# Patient Record
Sex: Male | Born: 1960 | Race: White | Hispanic: No | Marital: Married | Smoking: Current every day smoker
Health system: Southern US, Community
[De-identification: ages and names within clinical notes are randomized; demographics above are authoritative.]

## PROBLEM LIST (undated history)

## (undated) DIAGNOSIS — I10 Essential (primary) hypertension: Secondary | ICD-10-CM

## (undated) DIAGNOSIS — M109 Gout, unspecified: Secondary | ICD-10-CM

---

## 2006-06-27 ENCOUNTER — Ambulatory Visit (HOSPITAL_COMMUNITY): Admission: RE | Admit: 2006-06-27 | Discharge: 2006-06-27 | Payer: Self-pay | Admitting: Family Medicine

## 2013-05-19 ENCOUNTER — Ambulatory Visit (HOSPITAL_COMMUNITY)
Admission: RE | Admit: 2013-05-19 | Discharge: 2013-05-19 | Disposition: A | Discharge status: Home / Self Care | Payer: No Typology Code available for payment source | Source: Ambulatory Visit | Attending: Family Medicine | Admitting: Family Medicine

## 2013-05-19 ENCOUNTER — Other Ambulatory Visit (HOSPITAL_COMMUNITY): Payer: Self-pay | Admitting: Family Medicine

## 2013-05-19 DIAGNOSIS — J449 Chronic obstructive pulmonary disease, unspecified: Secondary | ICD-10-CM

## 2013-05-19 DIAGNOSIS — F17200 Nicotine dependence, unspecified, uncomplicated: Secondary | ICD-10-CM

## 2013-05-19 DIAGNOSIS — J438 Other emphysema: Secondary | ICD-10-CM | POA: Insufficient documentation

## 2015-12-31 ENCOUNTER — Encounter (HOSPITAL_COMMUNITY): Payer: Self-pay | Admitting: *Deleted

## 2015-12-31 ENCOUNTER — Emergency Department (HOSPITAL_COMMUNITY): Payer: BLUE CROSS/BLUE SHIELD

## 2015-12-31 ENCOUNTER — Emergency Department (HOSPITAL_COMMUNITY)
Admission: EM | Admit: 2015-12-31 | Discharge: 2015-12-31 | Disposition: A | Discharge status: Home / Self Care | Payer: BLUE CROSS/BLUE SHIELD | Attending: Emergency Medicine | Admitting: Emergency Medicine

## 2015-12-31 DIAGNOSIS — Z23 Encounter for immunization: Secondary | ICD-10-CM | POA: Diagnosis not present

## 2015-12-31 DIAGNOSIS — F10920 Alcohol use, unspecified with intoxication, uncomplicated: Secondary | ICD-10-CM

## 2015-12-31 DIAGNOSIS — Y929 Unspecified place or not applicable: Secondary | ICD-10-CM | POA: Insufficient documentation

## 2015-12-31 DIAGNOSIS — S0101XA Laceration without foreign body of scalp, initial encounter: Secondary | ICD-10-CM

## 2015-12-31 DIAGNOSIS — F10120 Alcohol abuse with intoxication, uncomplicated: Secondary | ICD-10-CM | POA: Diagnosis not present

## 2015-12-31 DIAGNOSIS — Y939 Activity, unspecified: Secondary | ICD-10-CM | POA: Insufficient documentation

## 2015-12-31 DIAGNOSIS — F17210 Nicotine dependence, cigarettes, uncomplicated: Secondary | ICD-10-CM | POA: Diagnosis not present

## 2015-12-31 DIAGNOSIS — S0990XA Unspecified injury of head, initial encounter: Secondary | ICD-10-CM

## 2015-12-31 DIAGNOSIS — I10 Essential (primary) hypertension: Secondary | ICD-10-CM | POA: Insufficient documentation

## 2015-12-31 DIAGNOSIS — Y999 Unspecified external cause status: Secondary | ICD-10-CM | POA: Diagnosis not present

## 2015-12-31 DIAGNOSIS — Z79899 Other long term (current) drug therapy: Secondary | ICD-10-CM | POA: Insufficient documentation

## 2015-12-31 DIAGNOSIS — W1809XA Striking against other object with subsequent fall, initial encounter: Secondary | ICD-10-CM | POA: Diagnosis not present

## 2015-12-31 HISTORY — DX: Essential (primary) hypertension: I10

## 2015-12-31 HISTORY — DX: Gout, unspecified: M10.9

## 2015-12-31 MED ORDER — TETANUS-DIPHTH-ACELL PERTUSSIS 5-2.5-18.5 LF-MCG/0.5 IM SUSP
0.5000 mL | Freq: Once | INTRAMUSCULAR | Status: AC
  Administered 2015-12-31: 0.5 mL via INTRAMUSCULAR
  Filled 2015-12-31: qty 0.5

## 2015-12-31 MED ORDER — LIDOCAINE-EPINEPHRINE (PF) 1 %-1:200000 IJ SOLN
10.0000 mL | Freq: Once | INTRAMUSCULAR | Status: DC
  Filled 2015-12-31: qty 10

## 2015-12-31 MED ORDER — LIDOCAINE-EPINEPHRINE (PF) 1 %-1:200000 IJ SOLN
INTRAMUSCULAR | Status: AC
  Filled 2015-12-31: qty 30

## 2015-12-31 NOTE — ED Notes (Signed)
Pt states he has been taking OTC cold medicine, he know his BP is high and will take BP med once he gets home

## 2015-12-31 NOTE — ED Provider Notes (Signed)
TIME SEEN: 2:15 AM  CHIEF COMPLAINT: Head injury, intoxication  HPI: Pt is a 55 y.o. male with history of hypertension, gout, alcohol and tobacco abuse who presents the emergency department with complaints of head injury. 14 drink half pint of alcohol a day and fell backward striking his head. No loss of consciousness. Not on anticoagulation. Denies numbness, tingling or focal weakness. Has a 7 cm laceration to the back of his scalp. Unsure when his last tetanus vaccination was. No other injury. Denies pain.  ROS: See HPI Constitutional: no fever  Eyes: no drainage  ENT: no runny nose   Cardiovascular:  no chest pain  Resp: no SOB  GI: no vomiting GU: no dysuria Integumentary: no rash  Allergy: no hives  Musculoskeletal: no leg swelling  Neurological: no slurred speech ROS otherwise negative  PAST MEDICAL HISTORY/PAST SURGICAL HISTORY:  Past Medical History  Diagnosis Date  . Hypertension   . Gout     MEDICATIONS:  Prior to Admission medications   Medication Sig Start Date End Date Taking? Authorizing Provider  allopurinol (ZYLOPRIM) 100 MG tablet Take 100 mg by mouth daily.   Yes Historical Provider, MD  olmesartan (BENICAR) 20 MG tablet Take 20 mg by mouth daily.   Yes Historical Provider, MD    ALLERGIES:  No Known Allergies  SOCIAL HISTORY:  Social History  Substance Use Topics  . Smoking status: Current Every Day Smoker    Types: Cigarettes  . Smokeless tobacco: Not on file  . Alcohol Use: Yes    FAMILY HISTORY: No family history on file.  EXAM: BP 188/92 mmHg  Pulse 106  Temp(Src) 97.6 F (36.4 C) (Oral)  Resp 20  Ht 5\' 11"  (1.803 m)  Wt 141 lb (63.957 kg)  BMI 19.67 kg/m2  SpO2 100% CONSTITUTIONAL: Alert and oriented and responds appropriately to questions. Chronically ill-appearing, appears intoxicated, GCS 15, appears intoxicated HEAD: Normocephalic; 8 cm laceration to the posterior inferior scalp EYES: Conjunctivae clear, PERRL, EOMI ENT: normal  nose; no rhinorrhea; moist mucous membranes; pharynx without lesions noted; no dental injury; no septal hematoma NECK: Supple, no meningismus, no LAD; no midline spinal tenderness, step-off or deformity CARD: RRR; S1 and S2 appreciated; no murmurs, no clicks, no rubs, no gallops RESP: Normal chest excursion without splinting or tachypnea; breath sounds clear and equal bilaterally; no wheezes, no rhonchi, no rales; no hypoxia or respiratory distress CHEST:  chest wall stable, no crepitus or ecchymosis or deformity, nontender to palpation ABD/GI: Normal bowel sounds; non-distended; soft, non-tender, no rebound, no guarding PELVIS:  stable, nontender to palpation BACK:  The back appears normal and is non-tender to palpation, there is no CVA tenderness; no midline spinal tenderness, step-off or deformity EXT: Normal ROM in all joints; non-tender to palpation; no edema; normal capillary refill; no cyanosis, no bony tenderness or bony deformity of patient's extremities, no joint effusion, no ecchymosis or lacerations    SKIN: Normal color for age and race; warm NEURO: Moves all extremities equally, sensation to light touch intact diffusely, cranial nerves II through XII intact, slightly unsteady gait PSYCH: The patient's mood and manner are appropriate. Grooming and personal hygiene are appropriate.  MEDICAL DECISION MAKING: Patient here intoxicated with a fall. Has a large laceration to the back of his scalp which we will cleaned and stapled close. Will update his tetanus vaccination. Will CT his head and cervical spine given he is intoxicated. No other sign of trauma on exam. He is neurologically intact other than slightly unsteady  gait secondary to being intoxicated. Will monitor patient in the emergency department. Patient and his family are very eager to be discharged but agreed to stay for imaging.  ED PROGRESS: Patient's imaging shows no intracranial injury, fracture of the cervical spine or  subluxation. No intracranial hemorrhage. Laceration has been repaired. Repair using staples. He was irrigated copiously with saline. Tetanus has been updated. No foreign bodies or dirty wound seen on exam. I do not feel he needs to be on prophylactic antibiotics. He has been monitored and appears more chronically sober than previous. He is able to ambulate with a steady gait. Slurred speech has also improved. His nephew is at bedside who is clinically sober will drive him home. He does not have any further complaints. No other sign of trauma on exam. Discussed head injury return precautions and recommended they follow-up with her PCP Dr. Maudie Mercury to have staples removed in one week. Wife at bedside as well.    At this time, I do not feel there is any life-threatening condition present. I have reviewed and discussed all results (EKG, imaging, lab, urine as appropriate), exam findings with patient. I have reviewed nursing notes and appropriate previous records.  I feel the patient is safe to be discharged home without further emergent workup. Discussed usual and customary return precautions. Patient and family (if present) verbalize understanding and are comfortable with this plan.  Patient will follow-up with their primary care provider. If they do not have a primary care provider, information for follow-up has been provided to them. All questions have been answered.   LACERATION REPAIR Performed by: Nyra Jabs Authorized by: Nyra Jabs Consent: Verbal consent obtained. Risks and benefits: risks, benefits and alternatives were discussed Consent given by: patient Patient identity confirmed: provided demographic data Prepped and Draped in normal sterile fashion Wound explored  Laceration Location: Scalp laceration  Laceration Length: 8 cm  No Foreign Bodies seen or palpated  Anesthesia: local infiltration  Local anesthetic: lidocaine 1 % with epinephrine  Anesthetic total: 5  ml  Irrigation method: 1 L bottle of saline  Amount of cleaning: Copious irrigation   Skin closure: Superficial with staples   Number of sutures: 9 staples   Technique: Area anesthetized using lidocaine 1% with epinephrine. Irrigated copiously with saline, cleaned with Betadine and 9 staples placed with good approximation of wound edges.   Patient tolerance: Patient tolerated the procedure well with no immediate complications.     [place], DO 12/31/15 [phone]

## 2015-12-31 NOTE — ED Notes (Signed)
Patient given discharge instruction, verbalized understand. Patient ambulatory out of the department with wife and son. Son states he is driving.

## 2015-12-31 NOTE — Discharge Instructions (Signed)
Alcohol Intoxication °Alcohol intoxication occurs when the amount of alcohol that a person has consumed impairs his or her ability to mentally and physically function. Alcohol directly impairs the normal chemical activity of the brain. Drinking large amounts of alcohol can lead to changes in mental function and behavior, and it can cause many physical effects that can be harmful.  °Alcohol intoxication can range in severity from mild to very severe. Various factors can affect the level of intoxication that occurs, such as the person's age, gender, weight, frequency of alcohol consumption, and the presence of other medical conditions (such as diabetes, seizures, or heart conditions). Dangerous levels of alcohol intoxication may occur when people drink large amounts of alcohol in a short period (binge drinking). Alcohol can also be especially dangerous when combined with certain prescription medicines or "recreational" drugs. °SIGNS AND SYMPTOMS °Some common signs and symptoms of mild alcohol intoxication include: °· Loss of coordination. °· Changes in mood and behavior. °· Impaired judgment. °· Slurred speech. °As alcohol intoxication progresses to more severe levels, other signs and symptoms will appear. These may include: °· Vomiting. °· Confusion and impaired memory. °· Slowed breathing. °· Seizures. °· Loss of consciousness. °DIAGNOSIS  °Your health care provider will take a medical history and perform a physical exam. You will be asked about the amount and type of alcohol you have consumed. Blood tests will be done to measure the concentration of alcohol in your blood. In many places, your blood alcohol level must be lower than 80 mg/dL (0.08%) to legally drive. However, many dangerous effects of alcohol can occur at much lower levels.  °TREATMENT  °People with alcohol intoxication often do not require treatment. Most of the effects of alcohol intoxication are temporary, and they go away as the alcohol naturally  leaves the body. Your health care provider will monitor your condition until you are stable enough to go home. Fluids are sometimes given through an IV access tube to help prevent dehydration.  °HOME CARE INSTRUCTIONS °· Do not drive after drinking alcohol. °· Stay hydrated. Drink enough water and fluids to keep your urine clear or pale yellow. Avoid caffeine.   °· Only take over-the-counter or prescription medicines as directed by your health care provider.   °SEEK MEDICAL CARE IF:  °· You have persistent vomiting.   °· You do not feel better after a few days. °· You have frequent alcohol intoxication. Your health care provider can help determine if you should see a substance use treatment counselor. °SEEK IMMEDIATE MEDICAL CARE IF:  °· You become shaky or tremble when you try to stop drinking.   °· You shake uncontrollably (seizure).   °· You throw up (vomit) blood. This may be bright red or may look like black coffee grounds.   °· You have blood in your stool. This may be bright red or may appear as a black, tarry, bad smelling stool.   °· You become lightheaded or faint.   °MAKE SURE YOU:  °· Understand these instructions. °· Will watch your condition. °· Will get help right away if you are not doing well or get worse. °  °This information is not intended to replace advice given to you by your health care provider. Make sure you discuss any questions you have with your health care provider. °  °Document Released: 06/27/2005 Document Revised: 05/20/2013 Document Reviewed: 02/20/2013 °Elsevier Interactive Patient Education ©2016 [employer] ° °Head Injury, Adult °You have a head injury. Headaches and throwing up (vomiting) are common after a head injury. It should   be easy to wake up from sleeping. Sometimes you must stay in the hospital. Most problems happen within the first 24 hours. Side effects may occur up to 7-10 days after the injury.  WHAT ARE THE TYPES OF HEAD INJURIES? Head injuries can be as minor as  a bump. Some head injuries can be more severe. More severe head injuries include:  A jarring injury to the brain (concussion).  A bruise of the brain (contusion). This mean there is bleeding in the brain that can cause swelling.  A cracked skull (skull fracture).  Bleeding in the brain that collects, clots, and forms a bump (hematoma). WHEN SHOULD I GET HELP RIGHT AWAY?   You are confused or sleepy.  You cannot be woken up.  You feel sick to your stomach (nauseous) or keep throwing up (vomiting).  Your dizziness or unsteadiness is getting worse.  You have very bad, lasting headaches that are not helped by medicine. Take medicines only as told by your doctor.  You cannot use your arms or legs like normal.  You cannot walk.  You notice changes in the black spots in the center of the colored part of your eye (pupil).  You have clear or bloody fluid coming from your nose or ears.  You have trouble seeing. During the next 24 hours after the injury, you must stay with someone who can watch you. This person should get help right away (call 911 in the [place] if you start to shake and are not able to control it (have seizures), you pass out, or you are unable to wake up. HOW CAN I PREVENT A HEAD INJURY IN THE FUTURE?  Wear seat belts.  Wear a helmet while bike riding and playing sports like football.  Stay away from dangerous activities around the house. WHEN CAN I RETURN TO NORMAL ACTIVITIES AND ATHLETICS? See your doctor before doing these activities. You should not do normal activities or play contact sports until 1 week after the following symptoms have stopped:  Headache that does not go away.  Dizziness.  Poor attention.  Confusion.  Memory problems.  Sickness to your stomach or throwing up.  Tiredness.  Fussiness.  Bothered by bright lights or loud noises.  Anxiousness or depression.  Restless sleep. MAKE SURE YOU:   Understand these instructions.  Will  watch your condition.  Will get help right away if you are not doing well or get worse.   This information is not intended to replace advice given to you by your health care provider. Make sure you discuss any questions you have with your health care provider.   Document Released: 08/30/2008 Document Revised: 10/08/2014 Document Reviewed: 05/25/2013 Elsevier Interactive Patient Education 2016 [place], Adult A laceration is a cut that goes through all of the layers of the skin and into the tissue that is right under the skin. Some lacerations heal on their own. Others need to be closed with stitches (sutures), staples, skin adhesive strips, or skin glue. Proper laceration care minimizes the risk of infection and helps the laceration to heal better. HOW TO CARE FOR YOUR LACERATION If sutures or staples were used:  Keep the wound clean and dry.  If you were given a bandage (dressing), you should change it at least one time per day or as told by your health care provider. You should also change it if it becomes wet or dirty.  Keep the wound completely dry for the first 24 hours or  as told by your health care provider. After that time, you may shower or bathe. However, make sure that the wound is not soaked in water until after the sutures or staples have been removed.  Clean the wound one time each day or as told by your health care provider:  Wash the wound with soap and water.  Rinse the wound with water to remove all soap.  Pat the wound dry with a clean towel. Do not rub the wound.  After cleaning the wound, apply a thin layer of antibiotic ointmentas told by your health care provider. This will help to prevent infection and keep the dressing from sticking to the wound.  Have the sutures or staples removed as told by your health care provider. If skin adhesive strips were used:  Keep the wound clean and dry.  If you were given a bandage (dressing), you  should change it at least one time per day or as told by your health care provider. You should also change it if it becomes dirty or wet.  Do not get the skin adhesive strips wet. You may shower or bathe, but be careful to keep the wound dry.  If the wound gets wet, pat it dry with a clean towel. Do not rub the wound.  Skin adhesive strips fall off on their own. You may trim the strips as the wound heals. Do not remove skin adhesive strips that are still stuck to the wound. They will fall off in time. If skin glue was used:  Try to keep the wound dry, but you may briefly wet it in the shower or bath. Do not soak the wound in water, such as by swimming.  After you have showered or bathed, gently pat the wound dry with a clean towel. Do not rub the wound.  Do not do any activities that will make you sweat heavily until the skin glue has fallen off on its own.  Do not apply liquid, cream, or ointment medicine to the wound while the skin glue is in place. Using those may loosen the film before the wound has healed.  If you were given a bandage (dressing), you should change it at least one time per day or as told by your health care provider. You should also change it if it becomes dirty or wet.  If a dressing is placed over the wound, be careful not to apply tape directly over the skin glue. Doing that may cause the glue to be pulled off before the wound has healed.  Do not pick at the glue. The skin glue usually remains in place for 5-10 days, then it falls off of the skin. General Instructions  Take over-the-counter and prescription medicines only as told by your health care provider.  If you were prescribed an antibiotic medicine or ointment, take or apply it as told by your doctor. Do not stop using it even if your condition improves.  To help prevent scarring, make sure to cover your wound with sunscreen whenever you are outside after stitches are removed, after adhesive strips are  removed, or when glue remains in place and the wound is healed. Make sure to wear a sunscreen of at least 30 SPF.  Do not scratch or pick at the wound.  Keep all follow-up visits as told by your health care provider. This is important.  Check your wound every day for signs of infection. Watch for:  Redness, swelling, or pain.  Fluid, blood, or  pus.  Raise (elevate) the injured area above the level of your heart while you are sitting or lying down, if possible. SEEK MEDICAL CARE IF:  You received a tetanus shot and you have swelling, severe pain, redness, or bleeding at the injection site.  You have a fever.  A wound that was closed breaks open.  You notice a bad smell coming from your wound or your dressing.  You notice something coming out of the wound, such as wood or glass.  Your pain is not controlled with medicine.  You have increased redness, swelling, or pain at the site of your wound.  You have fluid, blood, or pus coming from your wound.  You notice a change in the color of your skin near your wound.  You need to change the dressing frequently due to fluid, blood, or pus draining from the wound.  You develop a new rash.  You develop numbness around the wound. SEEK IMMEDIATE MEDICAL CARE IF:  You develop severe swelling around the wound.  Your pain suddenly increases and is severe.  You develop painful lumps near the wound or on skin that is anywhere on your body.  You have a red streak going away from your wound.  The wound is on your hand or foot and you cannot properly move a finger or toe.  The wound is on your hand or foot and you notice that your fingers or toes look pale or bluish.   This information is not intended to replace advice given to you by your health care provider. Make sure you discuss any questions you have with your health care provider.   Document Released: 09/17/2005 Document Revised: 02/01/2015 Document Reviewed:  09/13/2014 Elsevier Interactive Patient Education [employer].

## 2015-12-31 NOTE — ED Notes (Signed)
Pt states he drank 1/2 pint, drinks daily, fell off deck, hitting back of head, denies LOC, wrapped head, bleeding controlled

## 2015-12-31 NOTE — ED Notes (Signed)
Pt fell backwards hitting his head on the deck. Pt denies any LOC.

## 2016-07-17 ENCOUNTER — Encounter: Payer: Self-pay | Admitting: Internal Medicine

## 2016-08-06 ENCOUNTER — Ambulatory Visit: Payer: BLUE CROSS/BLUE SHIELD | Admitting: Gastroenterology

## 2018-08-13 ENCOUNTER — Emergency Department (HOSPITAL_COMMUNITY): Payer: BLUE CROSS/BLUE SHIELD

## 2018-08-13 ENCOUNTER — Encounter (HOSPITAL_COMMUNITY): Payer: Self-pay | Admitting: Emergency Medicine

## 2018-08-13 ENCOUNTER — Inpatient Hospital Stay (HOSPITAL_COMMUNITY)
Admission: EM | Admit: 2018-08-13 | Discharge: 2018-08-26 | DRG: 917 | Disposition: A | Discharge status: Home / Self Care | Payer: BLUE CROSS/BLUE SHIELD | Attending: Internal Medicine | Admitting: Internal Medicine

## 2018-08-13 DIAGNOSIS — D539 Nutritional anemia, unspecified: Secondary | ICD-10-CM | POA: Diagnosis present

## 2018-08-13 DIAGNOSIS — F17210 Nicotine dependence, cigarettes, uncomplicated: Secondary | ICD-10-CM | POA: Diagnosis present

## 2018-08-13 DIAGNOSIS — G92 Toxic encephalopathy: Secondary | ICD-10-CM | POA: Diagnosis present

## 2018-08-13 DIAGNOSIS — F332 Major depressive disorder, recurrent severe without psychotic features: Secondary | ICD-10-CM

## 2018-08-13 DIAGNOSIS — H0589 Other disorders of orbit: Secondary | ICD-10-CM | POA: Diagnosis not present

## 2018-08-13 DIAGNOSIS — F4325 Adjustment disorder with mixed disturbance of emotions and conduct: Secondary | ICD-10-CM | POA: Diagnosis present

## 2018-08-13 DIAGNOSIS — R1310 Dysphagia, unspecified: Secondary | ICD-10-CM | POA: Diagnosis not present

## 2018-08-13 DIAGNOSIS — M109 Gout, unspecified: Secondary | ICD-10-CM | POA: Diagnosis present

## 2018-08-13 DIAGNOSIS — E162 Hypoglycemia, unspecified: Secondary | ICD-10-CM | POA: Diagnosis not present

## 2018-08-13 DIAGNOSIS — I10 Essential (primary) hypertension: Secondary | ICD-10-CM | POA: Diagnosis present

## 2018-08-13 DIAGNOSIS — J449 Chronic obstructive pulmonary disease, unspecified: Secondary | ICD-10-CM | POA: Diagnosis present

## 2018-08-13 DIAGNOSIS — Z681 Body mass index (BMI) 19 or less, adult: Secondary | ICD-10-CM | POA: Diagnosis not present

## 2018-08-13 DIAGNOSIS — E785 Hyperlipidemia, unspecified: Secondary | ICD-10-CM | POA: Diagnosis present

## 2018-08-13 DIAGNOSIS — Z634 Disappearance and death of family member: Secondary | ICD-10-CM

## 2018-08-13 DIAGNOSIS — D3162 Benign neoplasm of unspecified site of left orbit: Secondary | ICD-10-CM | POA: Diagnosis present

## 2018-08-13 DIAGNOSIS — Z79899 Other long term (current) drug therapy: Secondary | ICD-10-CM

## 2018-08-13 DIAGNOSIS — F10239 Alcohol dependence with withdrawal, unspecified: Secondary | ICD-10-CM | POA: Diagnosis not present

## 2018-08-13 DIAGNOSIS — D72829 Elevated white blood cell count, unspecified: Secondary | ICD-10-CM | POA: Diagnosis not present

## 2018-08-13 DIAGNOSIS — R4182 Altered mental status, unspecified: Secondary | ICD-10-CM | POA: Diagnosis not present

## 2018-08-13 DIAGNOSIS — T424X2A Poisoning by benzodiazepines, intentional self-harm, initial encounter: Principal | ICD-10-CM | POA: Diagnosis present

## 2018-08-13 DIAGNOSIS — F10231 Alcohol dependence with withdrawal delirium: Secondary | ICD-10-CM | POA: Diagnosis not present

## 2018-08-13 DIAGNOSIS — Y92009 Unspecified place in unspecified non-institutional (private) residence as the place of occurrence of the external cause: Secondary | ICD-10-CM

## 2018-08-13 DIAGNOSIS — E440 Moderate protein-calorie malnutrition: Secondary | ICD-10-CM | POA: Diagnosis present

## 2018-08-13 DIAGNOSIS — Z9911 Dependence on respirator [ventilator] status: Secondary | ICD-10-CM

## 2018-08-13 DIAGNOSIS — J9690 Respiratory failure, unspecified, unspecified whether with hypoxia or hypercapnia: Secondary | ICD-10-CM

## 2018-08-13 DIAGNOSIS — J9601 Acute respiratory failure with hypoxia: Secondary | ICD-10-CM | POA: Diagnosis present

## 2018-08-13 DIAGNOSIS — J690 Pneumonitis due to inhalation of food and vomit: Secondary | ICD-10-CM | POA: Diagnosis present

## 2018-08-13 DIAGNOSIS — Z781 Physical restraint status: Secondary | ICD-10-CM | POA: Diagnosis not present

## 2018-08-13 DIAGNOSIS — F10229 Alcohol dependence with intoxication, unspecified: Secondary | ICD-10-CM | POA: Diagnosis present

## 2018-08-13 DIAGNOSIS — F10129 Alcohol abuse with intoxication, unspecified: Secondary | ICD-10-CM | POA: Diagnosis not present

## 2018-08-13 DIAGNOSIS — G9340 Encephalopathy, unspecified: Secondary | ICD-10-CM | POA: Diagnosis not present

## 2018-08-13 DIAGNOSIS — G9341 Metabolic encephalopathy: Secondary | ICD-10-CM | POA: Diagnosis not present

## 2018-08-13 LAB — COMPREHENSIVE METABOLIC PANEL
ALT: 32 U/L (ref 0–44)
AST: 35 U/L (ref 15–41)
Albumin: 4.2 g/dL (ref 3.5–5.0)
Alkaline Phosphatase: 41 U/L (ref 38–126)
Anion gap: 13 (ref 5–15)
BUN: 38 mg/dL — ABNORMAL HIGH (ref 6–20)
CHLORIDE: 104 mmol/L (ref 98–111)
CO2: 21 mmol/L — ABNORMAL LOW (ref 22–32)
Calcium: 9.3 mg/dL (ref 8.9–10.3)
Creatinine, Ser: 1.49 mg/dL — ABNORMAL HIGH (ref 0.61–1.24)
GFR, EST AFRICAN AMERICAN: 58 mL/min — AB (ref >60)
GFR, EST NON AFRICAN AMERICAN: 50 mL/min — AB (ref >60)
Glucose, Bld: 106 mg/dL — ABNORMAL HIGH (ref 70–99)
POTASSIUM: 4.5 mmol/L (ref 3.5–5.1)
SODIUM: 138 mmol/L (ref 135–145)
Total Bilirubin: 1 mg/dL (ref 0.3–1.2)
Total Protein: 7 g/dL (ref 6.5–8.1)

## 2018-08-13 LAB — GLUCOSE, CAPILLARY
GLUCOSE-CAPILLARY: 133 mg/dL — AB (ref 70–99)
Glucose-Capillary: 103 mg/dL — ABNORMAL HIGH (ref 70–99)

## 2018-08-13 LAB — CBC WITH DIFFERENTIAL/PLATELET
Abs Immature Granulocytes: 0.01 10*3/uL (ref 0.00–0.07)
BASOS ABS: 0.1 10*3/uL (ref 0.0–0.1)
Basophils Relative: 1 %
Eosinophils Absolute: 0.1 10*3/uL (ref 0.0–0.5)
Eosinophils Relative: 2 %
HCT: 40.3 % (ref 39.0–52.0)
Hemoglobin: 13.2 g/dL (ref 13.0–17.0)
IMMATURE GRANULOCYTES: 0 %
LYMPHS PCT: 29 %
Lymphs Abs: 1.9 10*3/uL (ref 0.7–4.0)
MCH: 34.2 pg — AB (ref 26.0–34.0)
MCHC: 32.8 g/dL (ref 30.0–36.0)
MCV: 104.4 fL — AB (ref 80.0–100.0)
Monocytes Absolute: 0.8 10*3/uL (ref 0.1–1.0)
Monocytes Relative: 12 %
NEUTROS ABS: 3.6 10*3/uL (ref 1.7–7.7)
NEUTROS PCT: 56 %
PLATELETS: 207 10*3/uL (ref 150–400)
RBC: 3.86 MIL/uL — AB (ref 4.22–5.81)
RDW: 13.8 % (ref 11.5–15.5)
WBC: 6.4 10*3/uL (ref 4.0–10.5)
nRBC: 0 % (ref 0.0–0.2)

## 2018-08-13 LAB — RAPID URINE DRUG SCREEN, HOSP PERFORMED
AMPHETAMINES: NOT DETECTED
BENZODIAZEPINES: POSITIVE — AB
Barbiturates: NOT DETECTED
Cocaine: NOT DETECTED
OPIATES: NOT DETECTED
Tetrahydrocannabinol: NOT DETECTED

## 2018-08-13 LAB — POCT I-STAT 3, ART BLOOD GAS (G3+)
Acid-base deficit: 8 mmol/L — ABNORMAL HIGH (ref 0.0–2.0)
Bicarbonate: 17.8 mmol/L — ABNORMAL LOW (ref 20.0–28.0)
O2 SAT: 95 %
PCO2 ART: 36.2 mmHg (ref 32.0–48.0)
PO2 ART: 81 mmHg — AB (ref 83.0–108.0)
Patient temperature: 98.6
TCO2: 19 mmol/L — AB (ref 22–32)
pH, Arterial: 7.301 — ABNORMAL LOW (ref 7.350–7.450)

## 2018-08-13 LAB — URINALYSIS, ROUTINE W REFLEX MICROSCOPIC
BILIRUBIN URINE: NEGATIVE
Glucose, UA: NEGATIVE mg/dL
HGB URINE DIPSTICK: NEGATIVE
Ketones, ur: 5 mg/dL — AB
Leukocytes, UA: NEGATIVE
Nitrite: NEGATIVE
PH: 5 (ref 5.0–8.0)
Protein, ur: NEGATIVE mg/dL
SPECIFIC GRAVITY, URINE: 1.016 (ref 1.005–1.030)

## 2018-08-13 LAB — ETHANOL

## 2018-08-13 LAB — SALICYLATE LEVEL

## 2018-08-13 LAB — I-STAT ARTERIAL BLOOD GAS, ED
ACID-BASE DEFICIT: 3 mmol/L — AB (ref 0.0–2.0)
ACID-BASE DEFICIT: 5 mmol/L — AB (ref 0.0–2.0)
BICARBONATE: 23 mmol/L (ref 20.0–28.0)
BICARBONATE: 21.7 mmol/L (ref 20.0–28.0)
O2 SAT: 89 %
O2 SAT: 79 %
PO2 ART: 58 mmHg — AB (ref 83.0–108.0)
Patient temperature: 97.5
Patient temperature: 97.5
TCO2: 24 mmol/L (ref 22–32)
TCO2: 23 mmol/L (ref 22–32)
pCO2 arterial: 43.1 mmHg (ref 32.0–48.0)
pCO2 arterial: 42.8 mmHg (ref 32.0–48.0)
pH, Arterial: 7.332 — ABNORMAL LOW (ref 7.350–7.450)
pH, Arterial: 7.309 — ABNORMAL LOW (ref 7.350–7.450)
pO2, Arterial: 46 mmHg — ABNORMAL LOW (ref 83.0–108.0)

## 2018-08-13 LAB — I-STAT CHEM 8, ED
BUN: 48 mg/dL — ABNORMAL HIGH (ref 6–20)
CHLORIDE: 106 mmol/L (ref 98–111)
Calcium, Ion: 1.18 mmol/L (ref 1.15–1.40)
Creatinine, Ser: 1.5 mg/dL — ABNORMAL HIGH (ref 0.61–1.24)
Glucose, Bld: 99 mg/dL (ref 70–99)
HEMATOCRIT: 41 % (ref 39.0–52.0)
Hemoglobin: 13.9 g/dL (ref 13.0–17.0)
POTASSIUM: 4.7 mmol/L (ref 3.5–5.1)
Sodium: 139 mmol/L (ref 135–145)
TCO2: 26 mmol/L (ref 22–32)

## 2018-08-13 LAB — ACETAMINOPHEN LEVEL

## 2018-08-13 LAB — TRIGLYCERIDES: Triglycerides: 73 mg/dL (ref <150)

## 2018-08-13 LAB — PHOSPHORUS: PHOSPHORUS: 3.7 mg/dL (ref 2.5–4.6)

## 2018-08-13 LAB — PROCALCITONIN

## 2018-08-13 LAB — CK: Total CK: 915 U/L — ABNORMAL HIGH (ref 49–397)

## 2018-08-13 LAB — MRSA PCR SCREENING: MRSA by PCR: NEGATIVE

## 2018-08-13 LAB — MAGNESIUM: Magnesium: 1.2 mg/dL — ABNORMAL LOW (ref 1.7–2.4)

## 2018-08-13 MED ORDER — ORAL CARE MOUTH RINSE
15.0000 mL | OROMUCOSAL | Status: DC
  Administered 2018-08-13 – 2018-08-15 (×17): 15 mL via OROMUCOSAL

## 2018-08-13 MED ORDER — PROPOFOL 1000 MG/100ML IV EMUL
0.0000 ug/kg/min | INTRAVENOUS | Status: DC
  Administered 2018-08-13 – 2018-08-14 (×2): 30 ug/kg/min via INTRAVENOUS
  Administered 2018-08-14: 20 ug/kg/min via INTRAVENOUS
  Administered 2018-08-14: 15 ug/kg/min via INTRAVENOUS
  Administered 2018-08-15: 20 ug/kg/min via INTRAVENOUS
  Filled 2018-08-13 (×4): qty 100

## 2018-08-13 MED ORDER — PROPOFOL 1000 MG/100ML IV EMUL
INTRAVENOUS | Status: AC
  Filled 2018-08-13: qty 100

## 2018-08-13 MED ORDER — BISACODYL 10 MG RE SUPP: 10.0000 mg | Freq: Every day | RECTAL | Status: DC | PRN

## 2018-08-13 MED ORDER — THIAMINE HCL 100 MG/ML IJ SOLN
100.0000 mg | Freq: Every day | INTRAMUSCULAR | Status: DC
  Administered 2018-08-13: 100 mg via INTRAVENOUS
  Filled 2018-08-13: qty 2

## 2018-08-13 MED ORDER — PROPOFOL 1000 MG/100ML IV EMUL
5.0000 ug/kg/min | Freq: Once | INTRAVENOUS | Status: AC
  Administered 2018-08-13: 20 ug/kg/min via INTRAVENOUS

## 2018-08-13 MED ORDER — DOCUSATE SODIUM 50 MG/5ML PO LIQD: 100.0000 mg | Freq: Two times a day (BID) | ORAL | Status: DC | PRN

## 2018-08-13 MED ORDER — FENTANYL CITRATE (PF) 100 MCG/2ML IJ SOLN
100.0000 ug | INTRAMUSCULAR | Status: DC | PRN
  Administered 2018-08-14 (×2): 100 ug via INTRAVENOUS
  Filled 2018-08-13 (×4): qty 2

## 2018-08-13 MED ORDER — FOLIC ACID 1 MG PO TABS
1.0000 mg | ORAL_TABLET | Freq: Every day | ORAL | Status: DC
  Administered 2018-08-14 – 2018-08-15 (×2): 1 mg
  Filled 2018-08-13 (×2): qty 1

## 2018-08-13 MED ORDER — SUCCINYLCHOLINE CHLORIDE 20 MG/ML IJ SOLN
100.0000 mg | Freq: Once | INTRAMUSCULAR | Status: AC
  Administered 2018-08-13: 100 mg via INTRAVENOUS
  Filled 2018-08-13: qty 5

## 2018-08-13 MED ORDER — FENTANYL CITRATE (PF) 100 MCG/2ML IJ SOLN: 100.0000 ug | INTRAMUSCULAR | Status: DC | PRN

## 2018-08-13 MED ORDER — IPRATROPIUM-ALBUTEROL 0.5-2.5 (3) MG/3ML IN SOLN
3.0000 mL | Freq: Once | RESPIRATORY_TRACT | Status: AC
  Administered 2018-08-13: 3 mL via RESPIRATORY_TRACT
  Filled 2018-08-13: qty 3

## 2018-08-13 MED ORDER — PANTOPRAZOLE SODIUM 40 MG PO PACK
40.0000 mg | PACK | Freq: Every day | ORAL | Status: DC
  Administered 2018-08-14 – 2018-08-15 (×2): 40 mg
  Filled 2018-08-13 (×3): qty 20

## 2018-08-13 MED ORDER — ETOMIDATE 2 MG/ML IV SOLN
20.0000 mg | Freq: Once | INTRAVENOUS | Status: AC
  Administered 2018-08-13: 20 mg via INTRAVENOUS

## 2018-08-13 MED ORDER — SODIUM CHLORIDE 0.9 % IV BOLUS
1000.0000 mL | Freq: Once | INTRAVENOUS | Status: AC
  Administered 2018-08-13: 1000 mL via INTRAVENOUS

## 2018-08-13 MED ORDER — HEPARIN SODIUM (PORCINE) 5000 UNIT/ML IJ SOLN
5000.0000 [IU] | Freq: Three times a day (TID) | INTRAMUSCULAR | Status: DC
  Administered 2018-08-13 – 2018-08-26 (×34): 5000 [IU] via SUBCUTANEOUS
  Filled 2018-08-13 (×35): qty 1

## 2018-08-13 MED ORDER — METHYLPREDNISOLONE SODIUM SUCC 125 MG IJ SOLR
125.0000 mg | Freq: Once | INTRAMUSCULAR | Status: AC
  Administered 2018-08-13: 125 mg via INTRAVENOUS
  Filled 2018-08-13: qty 2

## 2018-08-13 MED ORDER — LORAZEPAM 2 MG/ML IJ SOLN: 2.0000 mg | Freq: Once | INTRAMUSCULAR | Status: DC | PRN

## 2018-08-13 MED ORDER — PIPERACILLIN-TAZOBACTAM 3.375 G IVPB 30 MIN
3.3750 g | Freq: Once | INTRAVENOUS | Status: AC
  Administered 2018-08-13: 3.375 g via INTRAVENOUS
  Filled 2018-08-13: qty 50

## 2018-08-13 MED ORDER — IPRATROPIUM-ALBUTEROL 0.5-2.5 (3) MG/3ML IN SOLN: 3.0000 mL | RESPIRATORY_TRACT | Status: DC | PRN

## 2018-08-13 MED ORDER — CHLORHEXIDINE GLUCONATE 0.12% ORAL RINSE (MEDLINE KIT)
15.0000 mL | Freq: Two times a day (BID) | OROMUCOSAL | Status: DC
  Administered 2018-08-13 – 2018-08-15 (×4): 15 mL via OROMUCOSAL

## 2018-08-13 NOTE — ED Notes (Signed)
Pt's O2 sats 83% on 4L [place]. Placed nonrebreather on pt with minimal improvement to 87%. Pt unresponsive to painful stimuli. Notified MD. Preparing to intubate.

## 2018-08-13 NOTE — ED Notes (Signed)
Patient transported to CT 

## 2018-08-13 NOTE — ED Provider Notes (Signed)
[facility] EMERGENCY DEPARTMENT Provider Note   CSN: [id number] Arrival date & time: 08/13/18  1321     History   Chief Complaint Chief Complaint  Patient presents with  . Altered Mental Status    HPI Jeremiah Tucker is a 57 y.o. male.  HPI 57 year old male with past medical history as below here with altered mental status.  History is limited due to confusion on arrival.  Per EMS report, the patient is a chronic alcoholic.  His wife reportedly passed away last week, on hospice.  He was last seen normal yesterday.  He was found confused by his nephew today and EMS was called.  On EMS arrival, patient was minimally responsive to pain.  Nasal trumpet was placed.  Level 5 caveat invoked as remainder of history, ROS, and physical exam limited due to patient's AMS.  Past Medical History:  Diagnosis Date  . Gout   . Hypertension     Patient Active Problem List   Diagnosis Date Noted  . Acute encephalopathy 08/13/2018    History reviewed. No pertinent surgical history.      Home Medications    Prior to Admission medications   Medication Sig Start Date End Date Taking? Authorizing Provider  allopurinol (ZYLOPRIM) 100 MG tablet Take 100 mg by mouth daily.    [provider]  olmesartan (BENICAR) 20 MG tablet Take 20 mg by mouth daily.    [provider]    Family History No family history on file.  Social History Social History   Tobacco Use  . Smoking status: Current Every Day Smoker    Types: Cigarettes  Substance Use Topics  . Alcohol use: Yes  . Drug use: No     Allergies   Patient has no known allergies.   Review of Systems Review of Systems  Unable to perform ROS: Mental status change  Constitutional: Positive for fatigue.  Psychiatric/Behavioral: Positive for confusion.     Physical Exam Updated Vital Signs BP 106/68 (BP Location: Left Arm)   Pulse 89   Temp 98.5 F (36.9 C) (Oral)   Resp 18   Wt 64  kg   SpO2 96%   BMI 19.68 kg/m   Physical Exam  Constitutional: He appears well-developed and well-nourished. He appears lethargic. He has a sickly appearance. He appears distressed.  HENT:  Head: Normocephalic and atraumatic.  Eyes: Conjunctivae are normal.  Neck: Neck supple.  Cardiovascular: Normal rate, regular rhythm and normal heart sounds. Exam reveals no friction rub.  No murmur heard. Pulmonary/Chest: Effort normal and breath sounds normal. No respiratory distress. He has no wheezes. He has no rales.  Abdominal: He exhibits no distension.  Musculoskeletal: He exhibits no edema.  Neurological: He appears lethargic. He exhibits normal muscle tone.  Lethargic but intact gag, nasal trumpet in place. Noted to move all extremities spontaneously on arrival. Moans to pain, does not respond appropriately.  Skin: Skin is warm. Capillary refill takes less than 2 seconds.  Psychiatric: He has a normal mood and affect.  Nursing note and vitals reviewed.    ED Treatments / Results  Labs (all labs ordered are listed, but only abnormal results are displayed) Labs Reviewed  CBC WITH DIFFERENTIAL/PLATELET - Abnormal; Notable for the following components:      Result Value   RBC 3.86 (*)    MCV 104.4 (*)    MCH 34.2 (*)    All other components within normal limits  COMPREHENSIVE METABOLIC PANEL - Abnormal;  Notable for the following components:   CO2 21 (*)    Glucose, Bld 106 (*)    BUN 38 (*)    Creatinine, Ser 1.49 (*)    GFR calc non Af Amer 50 (*)    GFR calc Af Amer 58 (*)    All other components within normal limits  RAPID URINE DRUG SCREEN, HOSP PERFORMED - Abnormal; Notable for the following components:   Benzodiazepines POSITIVE (*)    All other components within normal limits  URINALYSIS, ROUTINE W REFLEX MICROSCOPIC - Abnormal; Notable for the following components:   Ketones, ur 5 (*)    All other components within normal limits  MAGNESIUM - Abnormal; Notable for the  following components:   Magnesium 1.2 (*)    All other components within normal limits  CBC - Abnormal; Notable for the following components:   WBC 15.3 (*)    RBC 3.96 (*)    MCV 104.3 (*)    All other components within normal limits  BASIC METABOLIC PANEL - Abnormal; Notable for the following components:   CO2 15 (*)    Glucose, Bld 146 (*)    BUN 35 (*)    Creatinine, Ser 1.38 (*)    GFR calc non Af Amer 55 (*)    Anion gap 16 (*)    All other components within normal limits  MAGNESIUM - Abnormal; Notable for the following components:   Magnesium 1.0 (*)    All other components within normal limits  ACETAMINOPHEN LEVEL - Abnormal; Notable for the following components:   Acetaminophen (Tylenol), Serum <10 (*)    All other components within normal limits  CK - Abnormal; Notable for the following components:   Total CK 915 (*)    All other components within normal limits  GLUCOSE, CAPILLARY - Abnormal; Notable for the following components:   Glucose-Capillary 103 (*)    All other components within normal limits  GLUCOSE, CAPILLARY - Abnormal; Notable for the following components:   Glucose-Capillary 133 (*)    All other components within normal limits  GLUCOSE, CAPILLARY - Abnormal; Notable for the following components:   Glucose-Capillary 138 (*)    All other components within normal limits  GLUCOSE, CAPILLARY - Abnormal; Notable for the following components:   Glucose-Capillary 197 (*)    All other components within normal limits  I-STAT CHEM 8, ED - Abnormal; Notable for the following components:   BUN 48 (*)    Creatinine, Ser 1.50 (*)    All other components within normal limits  I-STAT ARTERIAL BLOOD GAS, ED - Abnormal; Notable for the following components:   pH, Arterial 7.332 (*)    pO2, Arterial 58.0 (*)    Acid-base deficit 3.0 (*)    All other components within normal limits  I-STAT ARTERIAL BLOOD GAS, ED - Abnormal; Notable for the following components:    pH, Arterial 7.309 (*)    pO2, Arterial 46.0 (*)    Acid-base deficit 5.0 (*)    All other components within normal limits  POCT I-STAT 3, ART BLOOD GAS (G3+) - Abnormal; Notable for the following components:   pH, Arterial 7.301 (*)    pO2, Arterial 81.0 (*)    Bicarbonate 17.8 (*)    TCO2 19 (*)    Acid-base deficit 8.0 (*)    All other components within normal limits  MRSA PCR SCREENING  CULTURE, BLOOD (ROUTINE X 2)  CULTURE, BLOOD (ROUTINE X 2)  ETHANOL  PHOSPHORUS  PROCALCITONIN  PHOSPHORUS  TRIGLYCERIDES  SALICYLATE LEVEL  HIV ANTIBODY (ROUTINE TESTING W REFLEX)  BLOOD GAS, ARTERIAL  MAGNESIUM  MAGNESIUM  PHOSPHORUS  PHOSPHORUS    EKG None  Radiology Ct Head Wo Contrast  Result Date: 08/13/2018 CLINICAL DATA:  Altered mental status. EXAM: CT HEAD WITHOUT CONTRAST TECHNIQUE: Contiguous axial images were obtained from the base of the skull through the vertex without intravenous contrast. COMPARISON:  12/31/2015 FINDINGS: Brain: No evidence of acute infarction, hemorrhage, hydrocephalus, extra-axial collection or mass lesion/mass effect. There is ventricular and sulcal enlargement reflecting atrophy advanced for age. Vascular: No hyperdense vessel or unexpected calcification. Skull: Normal. Negative for fracture or focal lesion. Sinuses/Orbits: Homogeneous, 16 x 10 x 12 mm mass in the left orbit abutting the inferior rectus. Globes and orbits otherwise unremarkable. Mild left maxillary sinus mucosal thickening. Mild right ethmoid and left maxillary sinus mucosal thickening. Clear mastoid air cells. Right-sided nasal cannula. Other: None. IMPRESSION: 1. No acute intracranial abnormalities. 2. Atrophy advanced for age. 3. 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 It measures 16 mm in greatest dimension. This would be better characterized with orbital MRI with and without contrast. Electronically Signed   By: Lajean Manes M.D.    On: 08/13/2018 15:27   Dg Chest Portable 1 View  Result Date: 08/13/2018 CLINICAL DATA:  Status post intubation EXAM: PORTABLE CHEST 1 VIEW COMPARISON:  08/13/2018 FINDINGS: Endotracheal tube with the tip 3.5 cm above the carina. Nasogastric tube coursing below the diaphragm. Elevation of the left diaphragm. Mild left basilar airspace disease likely reflecting atelectasis versus pneumonia. No focal consolidation. No pleural effusion or pneumothorax. Stable cardiomediastinal silhouette. No acute osseous abnormality. IMPRESSION: 1. Endotracheal tube with the tip 3.5 cm of the the carina. Nasogastric tube coursing below the diaphragm. 2. Mild left basilar airspace disease which may reflect atelectasis versus pneumonia. Electronically Signed   By: Kathreen Devoid   On: 08/13/2018 17:11   Dg Chest Portable 1 View  Result Date: 08/13/2018 CLINICAL DATA:  Did dyspnea and altered mentation EXAM: PORTABLE CHEST 1 VIEW COMPARISON:  05/19/2013 FINDINGS: Emphysematous hyperinflation of the lungs. Normal heart size. Aortic atherosclerosis at the arch without aneurysm. Remote right-sided sixth and seventh rib fractures with healing. No acute osseous abnormality. No pulmonary consolidation or edema. Blunting of the left lateral costophrenic angle is noted, nonspecific but may reflect an area of atelectasis, scarring and/or trace effusion. IMPRESSION: 1. Pulmonary hyperinflation. 2. Blunting of the left lateral costophrenic angle as above possibly related to atelectasis, scarring and/or trace effusion. 3. Aortic atherosclerosis. Electronically Signed   By: Ashley Royalty M.D.   On: 08/13/2018 13:38    Procedures .Critical Care Performed by: Duffy Bruce, MD Authorized by: Duffy Bruce, MD   Critical care provider statement:    Critical care time (minutes):  75   Critical care time was exclusive of:  Separately billable procedures and treating other patients and teaching time   Critical care was necessary to  treat or prevent imminent or life-threatening deterioration of the following conditions:  Cardiac failure, circulatory failure, respiratory failure and metabolic crisis   Critical care was time spent personally by me on the following activities:  Development of treatment plan with patient or surrogate, discussions with consultants, evaluation of patient's response to treatment, examination of patient, obtaining history from patient or surrogate, ordering and performing treatments and interventions, ordering and review of laboratory studies, ordering and review of radiographic studies, pulse oximetry, re-evaluation of patient's condition and  review of old charts   I assumed direction of critical care for this patient from another provider in my specialty: no    Procedure Name: Intubation Date/Time: 08/14/2018 8:29 AM Performed by: Duffy Bruce, MD Pre-anesthesia Checklist: Patient identified, Patient being monitored, Emergency Drugs available, Timeout performed and Suction available Oxygen Delivery Method: Non-rebreather mask Preoxygenation: Pre-oxygenation with 100% oxygen Induction Type: Rapid sequence Ventilation: Mask ventilation without difficulty Laryngoscope Size: Mac and 4 Grade View: Grade I Tube size: 7.5 mm Number of attempts: 1 Airway Equipment and Method: Video-laryngoscopy and Rigid stylet Placement Confirmation: ETT inserted through vocal cords under direct vision,  CO2 detector and Breath sounds checked- equal and bilateral Secured at: 22 cm Tube secured with: ETT holder Dental Injury: Teeth and Oropharynx as per pre-operative assessment  Difficulty Due To: Difficulty was unanticipated Future Recommendations: Recommend- induction with short-acting agent, and alternative techniques readily available      (including critical care time)  Medications Ordered in ED Medications  heparin injection 5,000 Units (5,000 Units Subcutaneous Given 08/14/18 0653)  pantoprazole  sodium (PROTONIX) 40 mg/20 mL oral suspension 40 mg (has no administration in time range)  ipratropium-albuterol (DUONEB) 0.5-2.5 (3) MG/3ML nebulizer solution 3 mL (has no administration in time range)  folic acid (FOLVITE) tablet 1 mg (has no administration in time range)  fentaNYL (SUBLIMAZE) injection 100 mcg (has no administration in time range)  propofol (DIPRIVAN) 1000 MG/100ML infusion (20 mcg/kg/min  64 kg Intravenous Rate/Dose Change 08/14/18 0630)  docusate (COLACE) 50 MG/5ML liquid 100 mg (has no administration in time range)  bisacodyl (DULCOLAX) suppository 10 mg (has no administration in time range)  chlorhexidine gluconate (MEDLINE KIT) (PERIDEX) 0.12 % solution 15 mL (15 mLs Mouth Rinse Given 08/14/18 0748)  MEDLINE mouth rinse (15 mLs Mouth Rinse Given 08/14/18 0637)  LORazepam (ATIVAN) injection 2 mg (has no administration in time range)  feeding supplement (VITAL HIGH PROTEIN) liquid 1,000 mL (has no administration in time range)  feeding supplement (PRO-STAT SUGAR FREE 64) liquid 30 mL (has no administration in time range)  thiamine (VITAMIN B-1) tablet 100 mg (has no administration in time range)  lactated ringers infusion (has no administration in time range)  insulin aspart (novoLOG) injection 0-15 Units (has no administration in time range)  sodium chloride 0.9 % bolus 1,000 mL (0 mLs Intravenous Stopped 08/13/18 1641)  sodium chloride 0.9 % bolus 1,000 mL (0 mLs Intravenous Stopped 08/13/18 1750)  etomidate (AMIDATE) injection 20 mg (20 mg Intravenous Given 08/13/18 1639)  succinylcholine (ANECTINE) injection 100 mg (100 mg Intravenous Given 08/13/18 1640)  ipratropium-albuterol (DUONEB) 0.5-2.5 (3) MG/3ML nebulizer solution 3 mL (3 mLs Nebulization Given 08/13/18 1702)  propofol (DIPRIVAN) 1000 MG/100ML infusion (30 mcg/kg/min  64 kg Intravenous Rate/Dose Change 08/13/18 1707)  methylPREDNISolone sodium succinate (SOLU-MEDROL) 125 mg/2 mL injection 125 mg (125 mg  Intravenous Given 08/13/18 1707)  piperacillin-tazobactam (ZOSYN) IVPB 3.375 g (3.375 g Intravenous New Bag/Given 08/13/18 1817)  magnesium sulfate IVPB 2 g 50 mL (2 g Intravenous New Bag/Given 08/14/18 0654)     Initial Impression / Assessment and Plan / ED Course  I have reviewed the triage vital signs and the nursing notes.  Pertinent labs & imaging results that were available during my care of the patient were reviewed by me and considered in my medical decision making (see chart for details).     57 yo M with PMHx as above here with AMS. Pt drowsy on arrival but protecting airway currently. Per discussion with nephew, concern  for possible benzo or alcohol overdose in setting of recent death of wife. Will check gas, labs, CT, re-assess.  Alcohol negative.  UDS positive for benzodiazepines.  Blood gas showed persistent hypoxia.  Given his persistent hypoxia with worsening mental status, decision made to intubate for airway protection and hypoxia.  Patient tolerated well.  Chest x-ray shows possible aspiration which is consistent with his clinical history and reported cough by the nephew.  Will start on empiric antibiotics.  Admit to ICU for encephalopathy and altered mental status secondary to suspected benzodiazepine abuse as well as COPD and possible aspiration.  Final Clinical Impressions(s) / ED Diagnoses   Final diagnoses:  Acute respiratory failure with hypoxia [facility])  Encephalopathy    ED Discharge Orders    None       Duffy Bruce, MD 08/14/18 0830

## 2018-08-13 NOTE — H&P (Addendum)
NAME:  Jeremiah Tucker, MRN:  [id number], DOB:  01-24-1961, LOS: 0 ADMISSION DATE:  08/13/2018, CONSULTATION DATE:  08/13/2018 REFERRING MD:  Dr. Ellender Hose, CHIEF COMPLAINT:  AMS   Brief History   57 year old male w/PMH of ETOH presenting with AMS, +benzo's.  Wife passed 1 week ago.  Intubated for airway protection in ER.  History of present illness   HPI obtained from medical chart review and per patient's brother at bedside as patient is intubated and sedated on mechanical ventilation.  57 year old male with past medical history significant for heavy tobacco abuse and ETOH use, HTN and gout presenting with altered mental status.   Last known well 11/12 around 1900 and found by nephew today confused. Brother reports patient's wife of 30+ years passed 11/6. Since, reports patient has been smoking and drinking more but never stated any suicidal thoughts.  In ER, patient remains only responsive to pain and hypoxic and therefore intubated for airway protection.  Labs noted for ETOH neg, glucose 106, sCr 1.49 (unknown baseline), positive for benzodiazepines on UDS (although not prescribed) initial CXR negative, and negative CTH.  Placed on propofol gtt in ER for coughing and gagging on ETT, otherwise, hemodynamically stable and afebrile.  PCCM to admit.    Past Medical History  Tobacco abuse, HTN, HLD, gout, ETOH abuse  Significant Hospital Events   11/13 Admit  Consults:   Procedures:  11/13 ETT >>  Significant Diagnostic Tests:  11/13 CTH >> 1. No acute intracranial abnormalities. 2. Atrophy advanced for age. 3. 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 It measures 16 mm in greatest dimension. This would be better characterized with orbital MRI with and without contrast.  Micro Data:  11/13 MRSA PCR >> 11/13 BCx 2 >>  Antimicrobials:  11/13 Zosyn (in ER) 11/13 unasyn   Interim history/subjective:  On propofol 30  mcg/kg/min  Objective   Blood pressure (!) 172/77, pulse 93, temperature (!) 97.5 F (36.4 C), temperature source Oral, resp. rate (!) 23, weight 64 kg, SpO2 99 %.        Intake/Output Summary (Last 24 hours) at 08/13/2018 1705 Last data filed at 08/13/2018 1641 Gross per 24 hour  Intake 1000 ml  Output -  Net 1000 ml   Filed Weights   08/13/18 1654  Weight: 64 kg    Examination: General:  Adult male sedated on MV in NAD HEENT: MM pink/moist, copious oral secretions, ETT 7.5 at 23, OGT, pupils 3/sluggish, no JVD, + corneals Neuro: withdrawals to pain in all extremities, not f/c CV: RR, no murmur, +1 pulses PULM: even/non-labored on MV, bilateral rhonchi- w/small frothy secretions, diminished on left  JS:HFWY, +BS Extremities: cool/dry, no LE edema  Skin: no rashes  Resolved Hospital Problem list    Assessment & Plan:  Acute encephalopathy At risk for ETOH withdrawal  R/o unintentional/ intentional OD  - concerning for intentional overdose (+benzo on UDS)  - ETOH neg - CTH negative (did show a left intraconal mass) P:  Frequent neuro exams  Spot EEG Daily thiamine /folate Seizure precautions  Pending UA, tylenol and salicylate level MRI brain when able  Will need sitter and psych eval when able  PAD protocol with propofol and prn fentanyl, may consider switching to precedex  Acute airway insufficiency in the setting of acute encephalopathy  Tobacco Abuse P:  Full MV support, PRVC TV adjusted to 8cc/kg Recheck ABG at 1830 Intermittent CXR duonebs prn  R/o Aspiration PNA vs LL infiltrate  P:  Checking PCT  BC sent in ER unasyn for abx  Trend WBC/ fever curve   AKI - unclear baseline sCr -s/p 2L of NS P:  Trend renal function, mag, phos Checking CK Monitor UOP, bladder scan prn, may need foley   HTN P:  Monitor- currently hemodynamically stable   Left intraorbital Mass  P:  Will need follow-up orbital MRI with and without contrast when able,  ideally when renal function improved   Best practice:  Diet: NPO Pain/Anxiety/Delirium protocol (if indicated): propofol and prn fentanyl VAP protocol (if indicated): yes DVT prophylaxis: heparin SQ GI prophylaxis: protonix per tube Glucose control: monitor q 4, start SSI if glucose > 180 Mobility: BR Code Status: Full  Family Communication: patient's brother Konrad Dolores [phone]) or nephew Lennette Bihari [phone]) Disposition: ICU  Labs   CBC: Recent Labs  Lab 08/13/18 1328 08/13/18 1336  WBC 6.4  --   NEUTROABS 3.6  --   HGB 13.2 13.9  HCT 40.3 41.0  MCV 104.4*  --   PLT 207  --     Basic Metabolic Panel: Recent Labs  Lab 08/13/18 1328 08/13/18 1336  NA 138 139  K 4.5 4.7  CL 104 106  CO2 21*  --   GLUCOSE 106* 99  BUN 38* 48*  CREATININE 1.49* 1.50*  CALCIUM 9.3  --    GFR: CrCl cannot be calculated (Unknown ideal weight.). Recent Labs  Lab 08/13/18 1328  WBC 6.4    Liver Function Tests: Recent Labs  Lab 08/13/18 1328  AST 35  ALT 32  ALKPHOS 41  BILITOT 1.0  PROT 7.0  ALBUMIN 4.2   No results for input(s): LIPASE, AMYLASE in the last 168 hours. No results for input(s): AMMONIA in the last 168 hours.  ABG    Component Value Date/Time   PHART 7.309 (L) 08/13/2018 1611   PCO2ART 42.8 08/13/2018 1611   PO2ART 46.0 (L) 08/13/2018 1611   HCO3 21.7 08/13/2018 1611   TCO2 23 08/13/2018 1611   ACIDBASEDEF 5.0 (H) 08/13/2018 1611   O2SAT 79.0 08/13/2018 1611     Coagulation Profile: No results for input(s): INR, PROTIME in the last 168 hours.  Cardiac Enzymes: No results for input(s): CKTOTAL, CKMB, CKMBINDEX, TROPONINI in the last 168 hours.  HbA1C: No results found for: HGBA1C  CBG: No results for input(s): GLUCAP in the last 168 hours.  Review of Systems:   Unable   Past Medical History  He,  has a past medical history of Gout and Hypertension.   Surgical History   History reviewed. No pertinent surgical history.   Social  History   reports that he has been smoking cigarettes. He does not have any smokeless tobacco history on file. He reports that he drinks alcohol. He reports that he does not use drugs.   Family History   His family history is not on file.   Allergies No Known Allergies   Home Medications  Prior to Admission medications   Medication Sig Start Date End Date Taking? Authorizing Provider  allopurinol (ZYLOPRIM) 100 MG tablet Take 100 mg by mouth daily.    [provider]  olmesartan (BENICAR) 20 MG tablet Take 20 mg by mouth daily.    [provider]     Critical care time: 50 mins   Kennieth Rad, AGACNP-BC [place] Pgr: [phone] or if no answer [phone] 08/13/2018, 6:19 PM

## 2018-08-13 NOTE — ED Triage Notes (Signed)
Pt arrives via EMS from home with altered mental status, only responsive to deep painful stimuli, no verbal response. Gag reflex intact upon arrival to ED. Pt last seen well 1900. Pt is known to drink alcohol regularly.

## 2018-08-13 NOTE — Progress Notes (Signed)
Pt transported on vent from ED to 1Q82 without complications.

## 2018-08-14 ENCOUNTER — Inpatient Hospital Stay (HOSPITAL_COMMUNITY): Payer: BLUE CROSS/BLUE SHIELD

## 2018-08-14 ENCOUNTER — Encounter (HOSPITAL_COMMUNITY): Payer: Self-pay | Admitting: Pulmonary Disease

## 2018-08-14 DIAGNOSIS — E440 Moderate protein-calorie malnutrition: Secondary | ICD-10-CM | POA: Diagnosis present

## 2018-08-14 LAB — GLUCOSE, CAPILLARY
GLUCOSE-CAPILLARY: 197 mg/dL — AB (ref 70–99)
GLUCOSE-CAPILLARY: 161 mg/dL — AB (ref 70–99)
Glucose-Capillary: 138 mg/dL — ABNORMAL HIGH (ref 70–99)
Glucose-Capillary: 123 mg/dL — ABNORMAL HIGH (ref 70–99)

## 2018-08-14 LAB — CBC
HEMATOCRIT: 41.3 % (ref 39.0–52.0)
HEMOGLOBIN: 13.2 g/dL (ref 13.0–17.0)
MCH: 33.3 pg (ref 26.0–34.0)
MCHC: 32 g/dL (ref 30.0–36.0)
MCV: 104.3 fL — AB (ref 80.0–100.0)
Platelets: 197 10*3/uL (ref 150–400)
RBC: 3.96 MIL/uL — ABNORMAL LOW (ref 4.22–5.81)
RDW: 13.8 % (ref 11.5–15.5)
WBC: 15.3 10*3/uL — ABNORMAL HIGH (ref 4.0–10.5)
nRBC: 0 % (ref 0.0–0.2)

## 2018-08-14 LAB — BASIC METABOLIC PANEL
Anion gap: 16 — ABNORMAL HIGH (ref 5–15)
BUN: 35 mg/dL — ABNORMAL HIGH (ref 6–20)
CALCIUM: 8.9 mg/dL (ref 8.9–10.3)
CHLORIDE: 107 mmol/L (ref 98–111)
CO2: 15 mmol/L — AB (ref 22–32)
CREATININE: 1.38 mg/dL — AB (ref 0.61–1.24)
GFR calc non Af Amer: 55 mL/min — ABNORMAL LOW (ref >60)
GLUCOSE: 146 mg/dL — AB (ref 70–99)
Potassium: 4.6 mmol/L (ref 3.5–5.1)
Sodium: 138 mmol/L (ref 135–145)

## 2018-08-14 LAB — PHOSPHORUS
PHOSPHORUS: 4.1 mg/dL (ref 2.5–4.6)
PHOSPHORUS: 4.4 mg/dL (ref 2.5–4.6)
PHOSPHORUS: 3.9 mg/dL (ref 2.5–4.6)

## 2018-08-14 LAB — MAGNESIUM
Magnesium: 1 mg/dL — ABNORMAL LOW (ref 1.7–2.4)
Magnesium: 1.6 mg/dL — ABNORMAL LOW (ref 1.7–2.4)
Magnesium: 2 mg/dL (ref 1.7–2.4)

## 2018-08-14 LAB — HIV ANTIBODY (ROUTINE TESTING W REFLEX): HIV Screen 4th Generation wRfx: NONREACTIVE

## 2018-08-14 MED ORDER — VITAL AF 1.2 CAL PO LIQD
1000.0000 mL | ORAL | Status: DC
  Administered 2018-08-14: 1000 mL

## 2018-08-14 MED ORDER — SODIUM CHLORIDE 0.9 % IV SOLN
INTRAVENOUS | Status: DC
  Administered 2018-08-14: 09:00:00 via INTRAVENOUS

## 2018-08-14 MED ORDER — GADOBUTROL 1 MMOL/ML IV SOLN
6.0000 mL | Freq: Once | INTRAVENOUS | Status: AC | PRN
  Administered 2018-08-14: 6 mL via INTRAVENOUS

## 2018-08-14 MED ORDER — INSULIN ASPART 100 UNIT/ML ~~LOC~~ SOLN
0.0000 [IU] | SUBCUTANEOUS | Status: DC
  Administered 2018-08-14: 3 [IU] via SUBCUTANEOUS
  Administered 2018-08-14: 2 [IU] via SUBCUTANEOUS
  Administered 2018-08-14: 3 [IU] via SUBCUTANEOUS
  Administered 2018-08-21 – 2018-08-23 (×4): 2 [IU] via SUBCUTANEOUS

## 2018-08-14 MED ORDER — VITAL HIGH PROTEIN PO LIQD: 1000.0000 mL | ORAL | Status: DC

## 2018-08-14 MED ORDER — MAGNESIUM SULFATE 2 GM/50ML IV SOLN
2.0000 g | Freq: Once | INTRAVENOUS | Status: AC
  Administered 2018-08-14: 2 g via INTRAVENOUS
  Filled 2018-08-14: qty 50

## 2018-08-14 MED ORDER — VITAMIN B-1 100 MG PO TABS
100.0000 mg | ORAL_TABLET | Freq: Every day | ORAL | Status: DC
  Administered 2018-08-14 – 2018-08-15 (×2): 100 mg
  Filled 2018-08-14 (×3): qty 1

## 2018-08-14 MED ORDER — PRO-STAT SUGAR FREE PO LIQD
30.0000 mL | Freq: Two times a day (BID) | ORAL | Status: DC
  Administered 2018-08-14: 30 mL
  Filled 2018-08-14: qty 30

## 2018-08-14 MED ORDER — LACTATED RINGERS IV SOLN
INTRAVENOUS | Status: DC
  Administered 2018-08-14: 1000 mL via INTRAVENOUS
  Administered 2018-08-16 – 2018-08-22 (×8): via INTRAVENOUS

## 2018-08-14 MED ORDER — SODIUM CHLORIDE 0.9 % IV SOLN
3.0000 g | Freq: Three times a day (TID) | INTRAVENOUS | Status: DC
  Administered 2018-08-14 – 2018-08-15 (×3): 3 g via INTRAVENOUS
  Filled 2018-08-14 (×4): qty 3

## 2018-08-14 NOTE — Progress Notes (Signed)
[place] Progress Note Patient Name: Jeremiah Tucker DOB: [birth year] MRN: [id number]   Date of Service  08/14/2018  HPI/Events of Note  Magnesium 1.0  eICU Interventions  replaced     Intervention Category Intermediate Interventions: Electrolyte abnormality - evaluation and management  Mauri [place], P 08/14/2018, 6:40 AM

## 2018-08-14 NOTE — Progress Notes (Signed)
NAME:  Jeremiah Tucker, MRN:  [id number], DOB:  10-Sep-1961, LOS: 1 ADMISSION DATE:  08/13/2018, CONSULTATION DATE:  08/13/2018 REFERRING MD:  Dr. Ellender Hose, CHIEF COMPLAINT:  AMS   Brief History   57 yo male smoker with altered mental status.  ETT for airway.  Hx of ETOH.  UDS positive for benzo's.  Wife passed away 1 week prior to admission.  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  Getting Mg replacement.  Objective   Blood pressure 121/76, pulse 92, temperature 98.5 F (36.9 C), temperature source Oral, resp. rate 18, weight 64 kg, SpO2 99 %.    Vent Mode: PRVC FiO2 (%):  [35 %-100 %] 35 % Set Rate:  [18 bmp] 18 bmp Vt Set:  [500 mL-580 mL] 580 mL PEEP:  [5 cmH20] 5 cmH20 Plateau Pressure:  [15 cmH20-22 cmH20] 15 cmH20   Intake/Output Summary (Last 24 hours) at 08/14/2018 0750 Last data filed at 08/14/2018 0631 Gross per 24 hour  Intake 2126.72 ml  Output 1250 ml  Net 876.72 ml   Filed Weights   08/13/18 1654  Weight: 64 kg    Examination:  General - sedated Eyes - pupils reactive ENT - ETT in place Cardiac - regular rate/rhythm, no murmur Chest - b/l crackles Abdomen - soft, non tender, + bowel sounds GU - no lesions noted Extremities - no cyanosis, clubbing, or edema Skin - no rashes Lymphatics - no lymphadenopathy Neuro - RASS -2   Resolved Hospital Problem list    Assessment & Plan:   Acute metabolic encephalopathy. Possible intentional overdose of benzo's. Hx of ETOH. - RASS goal -1 - thiamine, folice acid - f/u EEG  Lt intraorbital mass. - MRI Lt orbit  Acute hypoxic respiratory failure with  compromised airway. - full vent support - f/u CXR - prn BDs  Aspiration pneumonia. - day 2 of unasyn  Hypomagnesemia. - replace Mg   Best practice:  Diet: tube feeds DVT prophylaxis: heparin SQ GI prophylaxis: protonix Mobility: bed rest Code Status: Full  Family Communication: no family at bedside  Labs   CBC: Recent Labs  Lab 08/13/18 1328 08/13/18 1336 08/14/18 0321  WBC 6.4  --  15.3*  NEUTROABS 3.6  --   --   HGB 13.2 13.9 13.2  HCT 40.3 41.0 41.3  MCV 104.4*  --  104.3*  PLT 207  --  299    Basic Metabolic Panel: Recent Labs  Lab 08/13/18 1328 08/13/18 1336 08/13/18 1749 08/14/18 0321  NA 138 139  --  138  K 4.5 4.7  --  4.6  CL 104 106  --  107  CO2 21*  --   --  15*  GLUCOSE 106* 99  --  146*  BUN 38* 48*  --  35*  CREATININE 1.49* 1.50*  --  1.38*  CALCIUM 9.3  --   --  8.9  MG  --   --  1.2* 1.0*  PHOS  --   --  3.7 4.1   GFR: CrCl cannot be calculated (Unknown ideal weight.). Recent Labs  Lab 08/13/18 1328 08/13/18 1749 08/14/18 0321  PROCALCITON  --  <  0.10  --   WBC 6.4  --  15.3*    Liver Function Tests: Recent Labs  Lab 08/13/18 1328  AST 35  ALT 32  ALKPHOS 41  BILITOT 1.0  PROT 7.0  ALBUMIN 4.2   No results for input(s): LIPASE, AMYLASE in the last 168 hours. No results for input(s): AMMONIA in the last 168 hours.  ABG    Component Value Date/Time   PHART 7.301 (L) 08/13/2018 1857   PCO2ART 36.2 08/13/2018 1857   PO2ART 81.0 (L) 08/13/2018 1857   HCO3 17.8 (L) 08/13/2018 1857   TCO2 19 (L) 08/13/2018 1857   ACIDBASEDEF 8.0 (H) 08/13/2018 1857   O2SAT 95.0 08/13/2018 1857     Coagulation Profile: No results for input(s): INR, PROTIME in the last 168 hours.  Cardiac Enzymes: Recent Labs  Lab 08/13/18 1749  CKTOTAL 915*    HbA1C: No results found for: HGBA1C  CBG: Recent Labs  Lab 08/13/18 1904 08/13/18 2325 08/14/18 0407  GLUCAP 103* 133* 138*    CC time 32 minutes  Chesley Mires, MD [facility]  Pulmonary/Critical Care 08/14/2018, 7:58 AM

## 2018-08-14 NOTE — Progress Notes (Signed)
EEG completed; results pending.    

## 2018-08-14 NOTE — Procedures (Signed)
ELECTROENCEPHALOGRAM REPORT   Patient: Jeremiah Tucker       Room #: 6X45W EEG No. ID: [id number] Age: 57 y.o.        Sex: male Referring Physician: Halford Chessman Report Date:  08/14/2018        Interpreting Physician: Alexis Goodell  History: Jeremiah Tucker is an 57 y.o. male with altered mental status  Medications:  Unasyn, Folvite, Insulin, Protonix, B1, Propofol  Conditions of Recording:  This is a 21 channel routine scalp EEG performed with bipolar and monopolar montages arranged in accordance to the international 10/20 system of electrode placement. One channel was dedicated to EKG recording.  The patient is in the intubated and sedated state.  Description:  The background activity is slow and poorly organized.  It consists of a low voltage polymorphic delta activity that is diffusely distributed.  There is superimposed, well organized beta activity that is superimposed and present throughout the recording.  Again this is diffusely distributed as well.  This activity is persistent throughout the recording.   No epileptiform activity is noted. Hyperventilation and intermittent photic stimulation were not performed.   IMPRESSION: This is an abnormal electroencephalogram characterized by general background slowing and superimposed beta activity.  This is consistent with the patient's medications.  No epileptiform activity is noted.     Alexis Goodell, MD Neurology [phone] 08/14/2018, 1:14 PM

## 2018-08-14 NOTE — Progress Notes (Signed)
Initial Nutrition Assessment  DOCUMENTATION CODES:   Non-severe (moderate) malnutrition in context of social or environmental circumstances  INTERVENTION:   Tube feeding via OG tube: - Start Vital AF 1.2 @ 25 ml/hr and increase by 10 ml q 8 hours until goal rate of 55 ml/hr is reached (1320 ml/day)  Tube feeding regimen at goal rate provides 1584 kcal, 99 grams of protein, and 1069 ml of H2O.   Tube feeding regimen at goal rate and current propofol provides 1729 total kcal (101% of needs).  Monitor magnesium, potassium, and phosphorus daily for at least 3 days, MD to replete as needed, as pt is at risk for refeeding syndrome given little PO intake x 1 week PTA, EtOH abuse, and moderate protein-calorie malnutrition.  NUTRITION DIAGNOSIS:   Moderate Malnutrition related to social / environmental circumstances (EtOH abuse) as evidenced by mild fat depletion, mild muscle depletion, moderate muscle depletion.  GOAL:   Patient will meet greater than or equal to 90% of their needs  MONITOR:   Vent status, Labs, Weight trends, TF tolerance, Skin, I & O's  REASON FOR ASSESSMENT:   Ventilator, Consult Enteral/tube feeding initiation and management  ASSESSMENT:   57 year old male who presented to the ED on 11/13 from home with AMS. PMH significant for EtOH abuse, gout, COPD, and hypertension. Pt intubated in the ED. UDS positive for benzodiazepines. Of note, pt lost his wife on 11/6. Family concerned for possible suicide attempt.  Vital High Protein @ 40 ml/hr and Pro-stat 30 ml BID ordered per ICU tube feeding protocol.  Discussed pt with RN.  Spoke with pt's family at bedside. Pt's family reports that pt's PO intake has been significantly decreased over the past week since his wife's death. They report that pt typically eats 1-2 "small meals" daily and drinks daily. They also share that pt has been drinking more after wife's death.  Pt's family denies that pt has lost any weight  and report his UBW is 170-180. Noted that pt's weight on admission was 141 lbs. Weight history in chart is limited as last weight recorded PTA is from 2017.  Patient is currently intubated on ventilator support. OG tube in stomach. MV: 10.6 L/min Temp (24hrs), Avg:97.7 F (36.5 C), Min:97.4 F (36.3 C), Max:98.5 F (36.9 C) BP: 124/72 MAP: 88  Propofol: 5.5 ml/hr (provides 145 kcal/day) NaCl w/ magnesium: 10 ml/hr  Medications reviewed and include: folic acid, SSI, Protonix, thiamine, lactated ringers  Labs reviewed: BUN 35 (H), creatinine 1.38 (H), magnesium 1.6 - being replaced CBG's: 197, 138, 133, 103  UOP: 400 ml x 24 hours OG output: 850 ml x 24 hours  NUTRITION - FOCUSED PHYSICAL EXAM:    Most Recent Value  Orbital Region  No depletion  Upper Arm Region  Mild depletion  Thoracic and Lumbar Region  Mild depletion  Buccal Region  Unable to assess  Temple Region  No depletion  Clavicle Bone Region  Mild depletion  Clavicle and Acromion Bone Region  Moderate depletion  Scapular Bone Region  Unable to assess  Dorsal Hand  No depletion  Patellar Region  Mild depletion  Anterior Thigh Region  Moderate depletion  Posterior Calf Region  Moderate depletion  Edema (RD Assessment)  None  Hair  Reviewed  Eyes  Unable to assess  Mouth  Unable to assess  Skin  Reviewed [scattered ecchymosis]  Nails  Reviewed       Diet Order:   Diet Order  Diet NPO time specified  Diet effective now              EDUCATION NEEDS:   Not appropriate for education at this time  Skin:  Skin Assessment: Reviewed RN Assessment (skin tear to right arm)  Last BM:  PTA/unknown  Height:   Ht Readings from Last 1 Encounters:  12/31/15 5\' 11"  (1.803 m)    Weight:   Wt Readings from Last 1 Encounters:  08/13/18 64 kg    Ideal Body Weight:  78.18 kg (using height from previous admission)  BMI:  Body mass index is 19.68 kg/m.  Estimated Nutritional Needs:   Kcal:   1710  Protein:  85-100 grams  Fluid:  >/= 1.7 L    Gaynell Face, MS, RD, LDN Inpatient Clinical Dietitian Pager: [phone] Weekend/After Hours: [phone]

## 2018-08-15 ENCOUNTER — Inpatient Hospital Stay (HOSPITAL_COMMUNITY): Payer: BLUE CROSS/BLUE SHIELD

## 2018-08-15 ENCOUNTER — Encounter (HOSPITAL_COMMUNITY): Payer: Self-pay

## 2018-08-15 LAB — GLUCOSE, CAPILLARY
GLUCOSE-CAPILLARY: 114 mg/dL — AB (ref 70–99)
GLUCOSE-CAPILLARY: 90 mg/dL (ref 70–99)
Glucose-Capillary: 110 mg/dL — ABNORMAL HIGH (ref 70–99)
Glucose-Capillary: 118 mg/dL — ABNORMAL HIGH (ref 70–99)
Glucose-Capillary: 95 mg/dL (ref 70–99)
Glucose-Capillary: 113 mg/dL — ABNORMAL HIGH (ref 70–99)

## 2018-08-15 LAB — COMPREHENSIVE METABOLIC PANEL
ALBUMIN: 3.2 g/dL — AB (ref 3.5–5.0)
ALT: 18 U/L (ref 0–44)
AST: 24 U/L (ref 15–41)
Alkaline Phosphatase: 33 U/L — ABNORMAL LOW (ref 38–126)
Anion gap: 11 (ref 5–15)
BUN: 42 mg/dL — AB (ref 6–20)
CHLORIDE: 105 mmol/L (ref 98–111)
CO2: 26 mmol/L (ref 22–32)
CREATININE: 1.51 mg/dL — AB (ref 0.61–1.24)
Calcium: 8.8 mg/dL — ABNORMAL LOW (ref 8.9–10.3)
GFR calc Af Amer: 57 mL/min — ABNORMAL LOW (ref >60)
GFR calc non Af Amer: 50 mL/min — ABNORMAL LOW (ref >60)
Glucose, Bld: 113 mg/dL — ABNORMAL HIGH (ref 70–99)
POTASSIUM: 4 mmol/L (ref 3.5–5.1)
SODIUM: 142 mmol/L (ref 135–145)
Total Bilirubin: 0.5 mg/dL (ref 0.3–1.2)
Total Protein: 6 g/dL — ABNORMAL LOW (ref 6.5–8.1)

## 2018-08-15 LAB — CBC
HEMATOCRIT: 36.4 % — AB (ref 39.0–52.0)
HEMOGLOBIN: 12 g/dL — AB (ref 13.0–17.0)
MCH: 34 pg (ref 26.0–34.0)
MCHC: 33 g/dL (ref 30.0–36.0)
MCV: 103.1 fL — AB (ref 80.0–100.0)
Platelets: 152 10*3/uL (ref 150–400)
RBC: 3.53 MIL/uL — ABNORMAL LOW (ref 4.22–5.81)
RDW: 14.6 % (ref 11.5–15.5)
WBC: 15.9 10*3/uL — ABNORMAL HIGH (ref 4.0–10.5)
nRBC: 0 % (ref 0.0–0.2)

## 2018-08-15 LAB — MAGNESIUM: Magnesium: 1.9 mg/dL (ref 1.7–2.4)

## 2018-08-15 LAB — PHOSPHORUS: PHOSPHORUS: 1.9 mg/dL — AB (ref 2.5–4.6)

## 2018-08-15 MED ORDER — FAMOTIDINE IN NACL 20-0.9 MG/50ML-% IV SOLN
20.0000 mg | INTRAVENOUS | Status: DC
  Administered 2018-08-15: 20 mg via INTRAVENOUS
  Filled 2018-08-15 (×2): qty 50

## 2018-08-15 MED ORDER — THIAMINE HCL 100 MG/ML IJ SOLN
100.0000 mg | Freq: Every day | INTRAMUSCULAR | Status: DC
  Administered 2018-08-15 – 2018-08-19 (×5): 100 mg via INTRAVENOUS
  Filled 2018-08-15 (×5): qty 2

## 2018-08-15 MED ORDER — LORAZEPAM 2 MG/ML IJ SOLN
1.0000 mg | INTRAMUSCULAR | Status: DC | PRN
  Administered 2018-08-15 – 2018-08-16 (×2): 1 mg via INTRAVENOUS
  Filled 2018-08-15 (×2): qty 1

## 2018-08-15 MED ORDER — ORAL CARE MOUTH RINSE
15.0000 mL | Freq: Two times a day (BID) | OROMUCOSAL | Status: DC
  Administered 2018-08-15 – 2018-08-26 (×18): 15 mL via OROMUCOSAL

## 2018-08-15 MED ORDER — FENTANYL CITRATE (PF) 100 MCG/2ML IJ SOLN
50.0000 ug | INTRAMUSCULAR | Status: DC | PRN
  Administered 2018-08-15 – 2018-08-16 (×3): 50 ug via INTRAVENOUS
  Filled 2018-08-15 (×3): qty 2

## 2018-08-15 MED ORDER — FOLIC ACID 5 MG/ML IJ SOLN
1.0000 mg | Freq: Every day | INTRAMUSCULAR | Status: DC
  Administered 2018-08-15 – 2018-08-19 (×5): 1 mg via INTRAVENOUS
  Filled 2018-08-15 (×7): qty 0.2

## 2018-08-15 NOTE — Procedures (Signed)
Extubation Procedure Note  Patient Details:   Name: Jeremiah Tucker DOB: 1960/10/20 MRN: [id number]   Airway Documentation:    Vent end date: 08/15/18 Vent end time: 1215   Evaluation  O2 sats: stable throughout Complications: No apparent complications Patient did tolerate procedure well. Bilateral Breath Sounds: Clear, Diminished   Yes  Rudene Re 08/15/2018, 12:25 PM

## 2018-08-15 NOTE — Progress Notes (Signed)
NAME:  Jeremiah Tucker, MRN:  [id number], DOB:  September 12, 1961, LOS: 2 ADMISSION DATE:  08/13/2018, CONSULTATION DATE:  08/13/2018 REFERRING MD:  Dr. Ellender Hose, CHIEF COMPLAINT:  AMS   Brief History   57 yo male smoker with altered mental status.  ETT for airway.  Hx of ETOH.  UDS positive for benzo's.  Wife passed away 1 week prior to admission.  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  Back on sedation.  Objective   Blood pressure (!) 113/58, pulse 85, temperature 97.9 F (36.6 C), temperature source Oral, resp. rate 18, weight 66.1 kg, SpO2 99 %.    Vent Mode: PRVC FiO2 (%):  [30 %-40 %] 30 % Set Rate:  [18 bmp] 18 bmp Vt Set:  [580 mL] 580 mL PEEP:  [5 cmH20] 5 cmH20 Plateau Pressure:  [13 cmH20-17 cmH20] 13 cmH20   Intake/Output Summary (Last 24 hours) at 08/15/2018 0901 Last data filed at 08/15/2018 0800 Gross per 24 hour  Intake 2121.78 ml  Output 1440 ml  Net 681.78 ml   Filed Weights   08/13/18 1654 08/15/18 0400  Weight: 64 kg 66.1 kg    Examination:  General - sedated Eyes - pupils reactive ENT - ETT in place Cardiac - regular rate/rhythm, no murmur Chest - equal breath sounds b/l, no wheezing or rales Abdomen - soft, non tender, + bowel sounds, no hepatosplenomegaly GU - no lesions noted Extremities - no cyanosis, clubbing, or edema Skin - no rashes Lymphatics - no  lymphadenopathy Neuro - RASS -2   General - sedated Eyes - pupils reactive ENT - ETT in place Cardiac - regular rate/rhythm, no murmur Chest - b/l crackles Abdomen - soft, non tender, + bowel sounds GU - no lesions noted Extremities - no cyanosis, clubbing, or edema Skin - no rashes Lymphatics - no lymphadenopathy Neuro - RASS -2  CXR 08/15/18 (reviewed by me) >> no infiltrate  Resolved Hospital Problem list    Assessment & Plan:   Acute metabolic encephalopathy. Possible intentional overdose of benzo's. Hx of ETOH. - RASS goal 0 - thiamine folic acid  Lt intraorbital masses. - will need to f/u with ophthalmology as outpt  Acute hypoxic respiratory failure with compromised airway. - pressure support as able - mental status barrier to extubation trial - f/u CXR intermittently  Possible aspiration pneumonia. - no definite infiltrate on CXR - will d/c unasyn  Hypomagnesemia. Hypophosphatemia. - replace electrolytes as needed   Best practice:  Diet: tube feeds DVT prophylaxis: heparin SQ GI prophylaxis: protonix Mobility: bed rest Code Status: Full  Family Communication: updated family at bedside 11/14.  Labs    CMP Latest Ref Rng & Units 08/15/2018 08/14/2018 08/13/2018  Glucose 70 - 99 mg/dL 113(H) 146(H) 99  BUN 6 - 20 mg/dL 42(H) 35(H) 48(H)  Creatinine 0.61 - 1.24 mg/dL 1.51(H) 1.38(H) 1.50(H)  Sodium 135 - 145 mmol/L 142 138 139  Potassium 3.5 - 5.1 mmol/L 4.0 4.6 4.7  Chloride 98 - 111 mmol/L 105 107 106  CO2 22 - 32 mmol/L 26 15(L) -  Calcium 8.9 - 10.3 mg/dL 8.8(L) 8.9 -  Total Protein 6.5 - 8.1 g/dL 6.0(L) - -  Total Bilirubin 0.3 - 1.2 mg/dL 0.5 - -  Alkaline Phos 38 - 126 U/L 33(L) - -  AST 15 - 41 U/L 24 - -  ALT 0 - 44 U/L 18 - -   CBC Latest Ref Rng & Units 08/15/2018 08/14/2018 08/13/2018  WBC 4.0 - 10.5 K/uL 15.9(H) 15.3(H) -  Hemoglobin 13.0 - 17.0 g/dL 12.0(L) 13.2 13.9  Hematocrit 39.0 - 52.0 % 36.4(L) 41.3 41.0  Platelets 150  - 400 K/uL 152 197 -   ABG    Component Value Date/Time   PHART 7.301 (L) 08/13/2018 1857   PCO2ART 36.2 08/13/2018 1857   PO2ART 81.0 (L) 08/13/2018 1857   HCO3 17.8 (L) 08/13/2018 1857   TCO2 19 (L) 08/13/2018 1857   ACIDBASEDEF 8.0 (H) 08/13/2018 1857   O2SAT 95.0 08/13/2018 1857   CBG (last 3)  Recent Labs    08/14/18 2327 08/15/18 0331 08/15/18 0717  GLUCAP 110* 118* 114*     CC time 31 minutes  Chesley Mires, MD [facility] Pulmonary/Critical Care 08/15/2018, 9:01 AM

## 2018-08-16 ENCOUNTER — Encounter (HOSPITAL_COMMUNITY): Payer: Self-pay

## 2018-08-16 ENCOUNTER — Other Ambulatory Visit: Payer: Self-pay

## 2018-08-16 LAB — BASIC METABOLIC PANEL
Anion gap: 10 (ref 5–15)
BUN: 24 mg/dL — AB (ref 6–20)
CHLORIDE: 107 mmol/L (ref 98–111)
CO2: 27 mmol/L (ref 22–32)
Calcium: 9.1 mg/dL (ref 8.9–10.3)
Creatinine, Ser: 1.13 mg/dL (ref 0.61–1.24)
GFR calc Af Amer: >60 mL/min (ref >60)
GFR calc non Af Amer: >60 mL/min (ref >60)
GLUCOSE: 88 mg/dL (ref 70–99)
POTASSIUM: 3.5 mmol/L (ref 3.5–5.1)
Sodium: 144 mmol/L (ref 135–145)

## 2018-08-16 LAB — GLUCOSE, CAPILLARY
GLUCOSE-CAPILLARY: 77 mg/dL (ref 70–99)
GLUCOSE-CAPILLARY: 80 mg/dL (ref 70–99)
GLUCOSE-CAPILLARY: 88 mg/dL (ref 70–99)
GLUCOSE-CAPILLARY: 92 mg/dL (ref 70–99)
Glucose-Capillary: 87 mg/dL (ref 70–99)
Glucose-Capillary: 88 mg/dL (ref 70–99)
Glucose-Capillary: 73 mg/dL (ref 70–99)

## 2018-08-16 LAB — MAGNESIUM: Magnesium: 1.2 mg/dL — ABNORMAL LOW (ref 1.7–2.4)

## 2018-08-16 LAB — PHOSPHORUS: Phosphorus: 2.9 mg/dL (ref 2.5–4.6)

## 2018-08-16 MED ORDER — SODIUM CHLORIDE 0.9 % IV SOLN
6.0000 g | Freq: Once | INTRAVENOUS | Status: AC
  Administered 2018-08-16: 6 g via INTRAVENOUS
  Filled 2018-08-16: qty 10

## 2018-08-16 MED ORDER — LORAZEPAM 1 MG PO TABS: 1.0000 mg | ORAL_TABLET | Freq: Four times a day (QID) | ORAL | Status: DC | PRN

## 2018-08-16 MED ORDER — LORAZEPAM 2 MG/ML IJ SOLN
1.0000 mg | Freq: Four times a day (QID) | INTRAMUSCULAR | Status: DC | PRN
  Administered 2018-08-17: 1 mg via INTRAVENOUS
  Filled 2018-08-16 (×2): qty 1

## 2018-08-16 MED ORDER — POTASSIUM CHLORIDE 10 MEQ/100ML IV SOLN
10.0000 meq | INTRAVENOUS | Status: AC
  Administered 2018-08-16 (×2): 10 meq via INTRAVENOUS
  Filled 2018-08-16 (×2): qty 100

## 2018-08-16 NOTE — Progress Notes (Signed)
Patient admitted to room 5M20 from 73M. Patient is alert with confusion to place situation and time. Patient also has slurred speech. Brother reports this is not his baseline. Bed alarm initiated for safety. Will continue to monitor. Dorthey Sawyer, RN

## 2018-08-16 NOTE — Progress Notes (Signed)
[facility] ADULT ICU REPLACEMENT PROTOCOL FOR AM LAB REPLACEMENT ONLY  The patient does apply for the [facility] Adult ICU Electrolyte Replacment Protocol based on the criteria listed below:   1. Is GFR >/= 40 ml/min? Yes.    Patient's GFR today is >60 2. Is urine output >/= 0.5 ml/kg/hr for the last 6 hours? Yes.   Patient's UOP is 1.2 ml/kg/hr 3. Is BUN < 60 mg/dL? yes  Patient's BUN today is 24 4. Abnormal electrolyte(s): K3.5,Mg1.2 5. Ordered repletion with: per protocol 6. If a panic level lab has been reported, has the CCM MD in charge been notified? Yes.  .   Physician:  A Panchal,MD  [facility] Clock 08/16/2018 5:04 AM

## 2018-08-16 NOTE — Progress Notes (Signed)
Pt transferred to 5MW20 at 1145. Report called to RN at 1115. Pt's brother at bedside and aware of transfer.

## 2018-08-16 NOTE — Progress Notes (Signed)
NAME:  Jeremiah Tucker, MRN:  [id number], DOB:  1961-04-17, LOS: 3 ADMISSION DATE:  08/13/2018, CONSULTATION DATE:  08/13/2018 REFERRING MD:  Dr. Ellender Hose, CHIEF COMPLAINT:  AMS   Brief History   57 yo male smoker with altered mental status.  ETT for airway.  Hx of ETOH.  UDS positive for benzo's.  Wife passed away 1 week prior to admission.  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  Denies chest pain, dyspnea, abdominal pain.  Has felt depresses after his wife passed away, but denies suicidal ideation.  Objective   BP 135/65   Pulse 89   Temp 98.7 F (37.1 C) (Axillary)   Resp 19   Wt 67.5 kg   SpO2 100%   BMI 20.75 kg/m   I/O last 3 completed shifts: In: 2998.9 [I.V.:2176.7; NG/GT:425; IV Piggyback:397.2] Out: 2385 [Urine:2385]  Examination:  General - alert Eyes - pupils reactive ENT - no sinus tenderness, no stridor, garbled speech Cardiac - regular rate/rhythm, no murmur Chest - equal breath sounds b/l, no wheezing or rales Abdomen - soft, non tender, + bowel sounds Extremities - no cyanosis, clubbing, or edema Skin - no rashes Neuro - CN intact, normal strength, moves extremities, follows commands Psych - normal mood and behavior   Resolved Hospital Problem list   Acute hypoxic respiratory failure with compromised airway  Assessment & Plan:    Acute metabolic encephalopathy. Possible intentional overdose of benzo's. Hx of ETOH. - continue thiamine, folic acid - CIWA protocol  Lt intraorbital masses. - will need f/u with ophthalmology as outpt  Hypomagnesemia. Hypophosphatemia. - replaces electrolytes as needed  Dysphagia. - speech therapy to assess before advancing diet  Deconditioning. - PT assessment   Best practice:  Diet: NPO DVT prophylaxis: heparin SQ GI prophylaxis: not indicated Mobility: OOB Code Status: Full  Family Communication: no family at bedside Disposition: to tele bed 11/16 >> to [facility] 11/17 and PCCM off  Labs    CMP Latest Ref Rng & Units 08/16/2018 08/15/2018 08/14/2018  Glucose 70 - 99 mg/dL 88 113(H) 146(H)  BUN 6 - 20 mg/dL 24(H) 42(H) 35(H)  Creatinine 0.61 - 1.24 mg/dL 1.13 1.51(H) 1.38(H)  Sodium 135 - 145 mmol/L 144 142 138  Potassium 3.5 - 5.1 mmol/L 3.5 4.0 4.6  Chloride 98 - 111 mmol/L 107 105 107  CO2 22 - 32 mmol/L 27 26 15(L)  Calcium 8.9 - 10.3 mg/dL 9.1 8.8(L) 8.9  Total Protein 6.5 - 8.1 g/dL - 6.0(L) -  Total Bilirubin 0.3 - 1.2 mg/dL - 0.5 -  Alkaline Phos 38 - 126 U/L - 33(L) -  AST 15 - 41 U/L - 24 -  ALT 0 - 44 U/L - 18 -   CBC Latest Ref Rng & Units 08/15/2018 08/14/2018 08/13/2018  WBC 4.0 - 10.5 K/uL 15.9(H) 15.3(H) -  Hemoglobin 13.0 - 17.0 g/dL 12.0(L) 13.2 13.9  Hematocrit 39.0 - 52.0 % 36.4(L) 41.3 41.0  Platelets 150 - 400 K/uL 152 197 -   ABG    Component Value Date/Time   PHART 7.301 (L) 08/13/2018 1857   PCO2ART 36.2 08/13/2018 1857   PO2ART 81.0 (L) 08/13/2018 1857   HCO3 17.8 (L) 08/13/2018 1857   TCO2 19 (L) 08/13/2018 1857   ACIDBASEDEF 8.0 (H) 08/13/2018 1857   O2SAT 95.0 08/13/2018 1857   CBG (last 3)  Recent Labs    08/15/18 2345 08/16/18 0403 08/16/18 0703  GLUCAP 88 92 Hutchins, MD [place] Pulmonary/Critical Care 08/16/2018, 10:34 AM

## 2018-08-17 LAB — GLUCOSE, CAPILLARY
GLUCOSE-CAPILLARY: 75 mg/dL (ref 70–99)
GLUCOSE-CAPILLARY: 68 mg/dL — AB (ref 70–99)
Glucose-Capillary: 152 mg/dL — ABNORMAL HIGH (ref 70–99)
Glucose-Capillary: 67 mg/dL — ABNORMAL LOW (ref 70–99)
Glucose-Capillary: 68 mg/dL — ABNORMAL LOW (ref 70–99)
Glucose-Capillary: 70 mg/dL (ref 70–99)
Glucose-Capillary: 77 mg/dL (ref 70–99)
Glucose-Capillary: 82 mg/dL (ref 70–99)

## 2018-08-17 LAB — BASIC METABOLIC PANEL
ANION GAP: 13 (ref 5–15)
BUN: 18 mg/dL (ref 6–20)
CALCIUM: 8.9 mg/dL (ref 8.9–10.3)
CO2: 25 mmol/L (ref 22–32)
Chloride: 107 mmol/L (ref 98–111)
Creatinine, Ser: 1.17 mg/dL (ref 0.61–1.24)
Glucose, Bld: 87 mg/dL (ref 70–99)
Potassium: 3.6 mmol/L (ref 3.5–5.1)
Sodium: 145 mmol/L (ref 135–145)

## 2018-08-17 LAB — MAGNESIUM: Magnesium: 1.6 mg/dL — ABNORMAL LOW (ref 1.7–2.4)

## 2018-08-17 MED ORDER — LORAZEPAM 2 MG/ML IJ SOLN
2.0000 mg | INTRAMUSCULAR | Status: DC | PRN
  Administered 2018-08-17: 2 mg via INTRAVENOUS
  Filled 2018-08-17: qty 1

## 2018-08-17 MED ORDER — DEXTROSE 50 % IV SOLN
INTRAVENOUS | Status: AC
  Administered 2018-08-17: 50 mL
  Filled 2018-08-17: qty 50

## 2018-08-17 MED ORDER — LORAZEPAM 2 MG/ML IJ SOLN
1.0000 mg | Freq: Once | INTRAMUSCULAR | Status: AC
  Administered 2018-08-17: 1 mg via INTRAVENOUS
  Filled 2018-08-17: qty 1

## 2018-08-17 MED ORDER — HALOPERIDOL LACTATE 5 MG/ML IJ SOLN
5.0000 mg | Freq: Four times a day (QID) | INTRAMUSCULAR | Status: DC | PRN
  Administered 2018-08-17 – 2018-08-21 (×4): 5 mg via INTRAVENOUS
  Filled 2018-08-17 (×3): qty 1

## 2018-08-17 MED ORDER — LORAZEPAM 2 MG/ML IJ SOLN
1.0000 mg | Freq: Once | INTRAMUSCULAR | Status: AC
  Administered 2018-08-17: 1 mg via INTRAVENOUS

## 2018-08-17 MED ORDER — LORAZEPAM 2 MG/ML IJ SOLN
2.0000 mg | Freq: Once | INTRAMUSCULAR | Status: AC
  Administered 2018-08-17: 2 mg via INTRAVENOUS
  Filled 2018-08-17 (×2): qty 1

## 2018-08-17 MED ORDER — LORAZEPAM 2 MG/ML IJ SOLN
2.0000 mg | INTRAMUSCULAR | Status: DC | PRN
  Administered 2018-08-17 – 2018-08-20 (×6): 2 mg via INTRAVENOUS
  Filled 2018-08-17 (×6): qty 1

## 2018-08-17 MED ORDER — DIAZEPAM 5 MG/ML IJ SOLN
2.5000 mg | Freq: Two times a day (BID) | INTRAMUSCULAR | Status: DC
  Administered 2018-08-20 – 2018-08-21 (×2): 2.5 mg via INTRAVENOUS
  Filled 2018-08-17 (×4): qty 2

## 2018-08-17 MED ORDER — HALOPERIDOL LACTATE 5 MG/ML IJ SOLN
INTRAMUSCULAR | Status: AC
  Administered 2018-08-17: 5 mg via INTRAVENOUS
  Filled 2018-08-17: qty 1

## 2018-08-17 MED ORDER — DEXTROSE 50 % IV SOLN
INTRAVENOUS | Status: AC
  Filled 2018-08-17: qty 50

## 2018-08-17 MED ORDER — LORAZEPAM 2 MG/ML IJ SOLN
2.0000 mg | Freq: Once | INTRAMUSCULAR | Status: DC
  Filled 2018-08-17: qty 1

## 2018-08-17 MED ORDER — DIAZEPAM 5 MG PO TABS: 5.0000 mg | ORAL_TABLET | Freq: Two times a day (BID) | ORAL | Status: DC

## 2018-08-17 MED ORDER — DIAZEPAM 5 MG PO TABS
5.0000 mg | ORAL_TABLET | Freq: Two times a day (BID) | ORAL | Status: DC
  Administered 2018-08-17 – 2018-08-22 (×8): 5 mg via ORAL
  Filled 2018-08-17 (×9): qty 1

## 2018-08-17 NOTE — Progress Notes (Signed)
PT Cancellation Note  Patient Details Name: Jeremiah Tucker MRN: [id number] DOB: 09-Jan-1961   Cancelled Treatment:    Reason Eval/Treat Not Completed: Patient not medically ready(per nursing, hold therapies at this time due to behaviors)    Duncan Dull 08/17/2018, 9:00 AM Alben Deeds, PT DPT  Board Certified Neurologic Specialist [place] Pager [phone] Office [phone]

## 2018-08-17 NOTE — Progress Notes (Addendum)
PROGRESS NOTE    Jeremiah Tucker  OFB:[id number] DOB: [birth year] DOA: 08/13/2018 PCP: [name], MD  Brief Narrative:   PCCM transfer for pickup today 11/17  -This is a 57 year old male with history of hypertension and alcohol abuse was brought to the emergency room after being found obtunded, minimally responsive, was suspected to be secondary to drug overdose, benzos noted in urine drug screen, alcohol intoxication, intubated on 11/13  -Was sedated with propofol in the ICU and fentanyl as needed, extubated 11/15  -Yesterday 11/16 he was transferred to the floor , subsequently became severely agitated, acute alcohol withdrawal/DTs, was given 8 mg of IV Ativan, 5 mg of Haldol between 12 and 4:30 AM today . -He was transferred from [facility] service to [facility] for pickup today 11/17  -Now patient is sedated/somnolent from effects of benzos and Haldol this morning   Assessment & Plan:     Acute encephalopathy -Initially secondary to benzo overdose, alcohol intoxication now with withdrawal/DTs -Required mechanical ventilation 11/13 to 11/15 -Severe depression, reported history of recent demise of his wife -severe agitation last night, given 8 mg of IV Ativan, 5 mg of Haldol between 12 and 4:30 AM today . -Add scheduled Valium -PRN Ativan -If agitation becomes very difficult to manage on stepdown I will transfer him back to the ICU -Will ask psych to evaluate once mental status significantly improved    Alcohol withdrawal -as Above    Malnutrition of moderate degree -add supplements when possible  Hypomagnesemia -Replaced  Essential hypertension -Home meds on hold at this time  DVT prophylaxis: Hep SQ Code Status: Full code Family Communication: no family at bedside Disposition Plan: Home pending stability  Consultants:   PCCM Tx   Procedures:   Antimicrobials:    Subjective: -severe agitation overnight  Objective: Vitals:   08/16/18 2026 08/17/18 0309 08/17/18 0420  08/17/18 0439  BP: 124/60 133/62 (!) 141/68 (!) 141/68  Pulse: 81 76 78 78  Resp: 18   (!) 21  Temp: 98.1 F (36.7 C)   98.8 F (37.1 C)  TempSrc: Oral   Oral  SpO2: 92% 99%  100%  Weight:    63.4 kg  Height:    6' (1.829 m)    Intake/Output Summary (Last 24 hours) at 08/17/2018 1041 Last data filed at 08/17/2018 0700 Gross per 24 hour  Intake 500 ml  Output 1700 ml  Net -1200 ml   Filed Weights   08/15/18 0400 08/16/18 0122 08/17/18 0439  Weight: 66.1 kg 67.5 kg 63.4 kg    Examination:  General exam: somnolent, obtunded, minimally arousable Respiratory system: Few scattered rhonchi Cardiovascular system: S1 & S2 heard, RRR Gastrointestinal system: Abdomen is nondistended, soft and nontender.Normal bowel sounds heard. Extremities: no edema Skin: No rashes, lesions or ulcers Psychiatry: unable to assess   Data Reviewed:   CBC: Recent Labs  Lab 08/13/18 1328 08/13/18 1336 08/14/18 0321 08/15/18 0610  WBC 6.4  --  15.3* 15.9*  NEUTROABS 3.6  --   --   --   HGB 13.2 13.9 13.2 12.0*  HCT 40.3 41.0 41.3 36.4*  MCV 104.4*  --  104.3* 103.1*  PLT 207  --  197 782   Basic Metabolic Panel: Recent Labs  Lab 08/13/18 1328 08/13/18 1336  08/14/18 0321 08/14/18 0814 08/14/18 1613 08/15/18 0610 08/16/18 0225 08/17/18 0624  NA 138 139  --  138  --   --  142 144 145  K 4.5 4.7  --  4.6  --   --  4.0 3.5 3.6  CL 104 106  --  107  --   --  105 107 107  CO2 21*  --   --  15*  --   --  26 27 25   GLUCOSE 106* 99  --  146*  --   --  113* 88 87  BUN 38* 48*  --  35*  --   --  42* 24* 18  CREATININE 1.49* 1.50*  --  1.38*  --   --  1.51* 1.13 1.17  CALCIUM 9.3  --   --  8.9  --   --  8.8* 9.1 8.9  MG  --   --    < > 1.0* 1.6* 2.0 1.9 1.2* 1.6*  PHOS  --   --    < > 4.1 4.4 3.9 1.9* 2.9  --    < > = values in this interval not displayed.   GFR: Estimated Creatinine Clearance: 62.5 mL/min (by C-G formula based on SCr of 1.17 mg/dL). Liver Function Tests: Recent Labs   Lab 08/13/18 1328 08/15/18 0610  AST 35 24  ALT 32 18  ALKPHOS 41 33*  BILITOT 1.0 0.5  PROT 7.0 6.0*  ALBUMIN 4.2 3.2*   No results for input(s): LIPASE, AMYLASE in the last 168 hours. No results for input(s): AMMONIA in the last 168 hours. Coagulation Profile: No results for input(s): INR, PROTIME in the last 168 hours. Cardiac Enzymes: Recent Labs  Lab 08/13/18 1749  CKTOTAL 915*   BNP (last 3 results) No results for input(s): PROBNP in the last 8760 hours. HbA1C: No results for input(s): HGBA1C in the last 72 hours. CBG: Recent Labs  Lab 08/16/18 2025 08/17/18 0019 08/17/18 0407 08/17/18 0504 08/17/18 0824  GLUCAP 73 70 68* 152* 67*   Lipid Profile: No results for input(s): CHOL, HDL, LDLCALC, TRIG, CHOLHDL, LDLDIRECT in the last 72 hours. Thyroid Function Tests: No results for input(s): TSH, T4TOTAL, FREET4, T3FREE, THYROIDAB in the last 72 hours. Anemia Panel: No results for input(s): VITAMINB12, FOLATE, FERRITIN, TIBC, IRON, RETICCTPCT in the last 72 hours. Urine analysis:    Component Value Date/Time   COLORURINE YELLOW 08/13/2018 1504   APPEARANCEUR CLEAR 08/13/2018 1504   LABSPEC 1.016 08/13/2018 1504   PHURINE 5.0 08/13/2018 1504   GLUCOSEU NEGATIVE 08/13/2018 1504   HGBUR NEGATIVE 08/13/2018 1504   BILIRUBINUR NEGATIVE 08/13/2018 1504   KETONESUR 5 (A) 08/13/2018 1504   PROTEINUR NEGATIVE 08/13/2018 1504   NITRITE NEGATIVE 08/13/2018 1504   LEUKOCYTESUR NEGATIVE 08/13/2018 1504   Sepsis Labs: @LABRCNTIP (procalcitonin:4,lacticidven:4)  ) Recent Results (from the past 240 hour(s))  Blood culture (routine x 2)     Status: None (Preliminary result)   Collection Time: 08/13/18  5:45 PM  Result Value Ref Range Status   Specimen Description BLOOD RIGHT ANTECUBITAL  Final   Special Requests   Final    BOTTLES DRAWN AEROBIC AND ANAEROBIC Blood Culture results may not be optimal due to an excessive volume of blood received in culture bottles    Culture   Final    NO GROWTH 3 DAYS Performed at [facility], [place] [address], [place], [place] [address]    Report Status PENDING  Incomplete  Blood culture (routine x 2)     Status: None (Preliminary result)   Collection Time: 08/13/18  6:00 PM  Result Value Ref Range Status   Specimen Description BLOOD RIGHT HAND  Final   Special Requests   Final  BOTTLES DRAWN AEROBIC AND ANAEROBIC Blood Culture results may not be optimal due to an excessive volume of blood received in culture bottles   Culture   Final    NO GROWTH 3 DAYS Performed at [facility], [place] [address], [place], [place] [address]    Report Status PENDING  Incomplete  MRSA PCR Screening     Status: None   Collection Time: 08/13/18  6:52 PM  Result Value Ref Range Status   MRSA by PCR NEGATIVE NEGATIVE Final    Comment:        The GeneXpert MRSA Assay (FDA approved for NASAL specimens only), is one component of a comprehensive MRSA colonization surveillance program. It is not intended to diagnose MRSA infection nor to guide or monitor treatment for MRSA infections. Performed at [facility], [place] [address] [place], [place] [address]          Radiology Studies: No results found.      Scheduled Meds: . dextrose      . diazepam  2.5 mg Intravenous BID   Or  . diazepam  5 mg Oral BID  . folic acid  1 mg Intravenous Daily  . heparin  5,000 Units Subcutaneous Q8H  . insulin aspart  0-15 Units Subcutaneous Q4H  . mouth rinse  15 mL Mouth Rinse BID  . thiamine injection  100 mg Intravenous Daily   Continuous Infusions: . lactated ringers 50 mL/hr at 08/17/18 0019     LOS: 4 days    Time spent: 48min    Domenic Polite, MD [facility] Page via [email or web address], password [email or web address] After 7PM please contact night-coverage  08/17/2018, 10:41 AM

## 2018-08-17 NOTE — Significant Event (Signed)
Rapid Response Event Note Called by Hayden Pedro NP for patient in DTs requiring frequent sedatives  Overview: Time Called: 0243 Arrival Time: 0250 Event Type: Neurologic  Initial Focused Assessment: Upon arrival, Mr. Hank was confused, intermittently disrupting medical equipment and trying to get OOB. He was also restrained.  CIWA was 17 after multiple doses of prn Ativan.  Hayden Pedro NP at bedside.  Transferring to SDU level of care for more frequent CIWA assessment and intervention.  Interventions: -Ativan prn -PIV -Restraints -tx SDU   Event Summary: Name of Physician Notified: Hayden Pedro NP (at bedside) at 0242    at    Outcome: Transferred (Comment)(Tx to [place])  Event End Time: 0400  Madelynn Done

## 2018-08-17 NOTE — Progress Notes (Signed)
Pt. Became very agitated, anxious, and combative. CIWA score came up to 15. Called security, provider, and rapid response. Pt. Was given 6mg   of ativan, and 5mg  of Haldol per NP. Pt. transferred to step down unit on 4E for further care. Report given to the nurse on the unit.

## 2018-08-17 NOTE — Progress Notes (Signed)
Patient is more alert and able to answer questions. He has been calm and cooperative during my shift. He has not been pulling at IV sites, foley cath, or O2 tubing. Spoke with Dr. Broadus John about patient behavior and she said we could discontinue restraint order. Removed restraints and observed skin integrity. There is not breakdown on skin and patient is behaving in a cooperative manner. Patient has a Actuary and is being monitored. I will continue to monitor patient behavior. He is resting comfortably in bed.

## 2018-08-17 NOTE — Evaluation (Signed)
Clinical/Bedside Swallow Evaluation Patient Details  Name: Jeremiah Tucker MRN: [id number] Date of Birth: Feb 10, 1961  Today's Date: 08/17/2018 Time: SLP Start Time (ACUTE ONLY): 1125 SLP Stop Time (ACUTE ONLY): 1138 SLP Time Calculation (min) (ACUTE ONLY): 13 min  Past Medical History:  Past Medical History:  Diagnosis Date  . Gout   . Hypertension    Past Surgical History: History reviewed. No pertinent surgical history. HPI:  57 year old male with h/o of hypertension and alcohol abuse was brought to the emergency room after being found obtunded, minimally responsive, was suspected to be secondary to drug overdose, benzos noted in urine drug screen, alcohol intoxication, intubated on 11/13, extubated 11/15. Now in DTs.    Assessment / Plan / Recommendation Clinical Impression  Patient presents with evidence of a pharyngeal phase dysphagia characterized by intermittently poor secretion management and evidence of aspiration with thin liquid trials. Suspect a combination of intubation and AMS. Recommend continued NPO. Will f/u in am to re-evaluate swallow at bedside.  SLP Visit Diagnosis: Dysphagia, pharyngeal phase (R13.13)    Aspiration Risk  Severe aspiration risk    Diet Recommendation NPO   Medication Administration: Via alternative means    Other  Recommendations Oral Care Recommendations: Oral care QID   Follow up Recommendations (TBD)      Frequency and Duration min 2x/week  2 weeks       Prognosis Prognosis for Safe Diet Advancement: Good Barriers to Reach Goals: Cognitive deficits      Swallow Study   General HPI: 57 year old male with h/o of hypertension and alcohol abuse was brought to the emergency room after being found obtunded, minimally responsive, was suspected to be secondary to drug overdose, benzos noted in urine drug screen, alcohol intoxication, intubated on 11/13, extubated 11/15. Now in DTs.  Type of Study: Bedside Swallow Evaluation Previous  Swallow Assessment: none Diet Prior to this Study: NPO Temperature Spikes Noted: No Respiratory Status: Nasal cannula History of Recent Intubation: Yes Length of Intubations (days): 2 days Date extubated: 08/15/18 Behavior/Cognition: Lethargic/Drowsy Oral Cavity Assessment: Within Functional Limits Oral Care Completed by SLP: Yes Oral Cavity - Dentition: Adequate natural dentition Vision: Functional for self-feeding Self-Feeding Abilities: Able to feed self;Needs assist Patient Positioning: Upright in bed Baseline Vocal Quality: Normal Volitional Cough: Weak;Congested Volitional Swallow: Able to elicit    Oral/Motor/Sensory Function Overall Oral Motor/Sensory Function: Within functional limits   Ice Chips Ice chips: Impaired Presentation: Spoon Pharyngeal Phase Impairments: Throat Clearing - Immediate   Thin Liquid Thin Liquid: Impaired Presentation: Cup Pharyngeal  Phase Impairments: Cough - Immediate    Nectar Thick Nectar Thick Liquid: Not tested   Honey Thick Honey Thick Liquid: Not tested   Puree Puree: Not tested   Solid     Solid: Not tested     Sumie Remsen MA, CCC-SLP   Aiesha Leland Meryl 08/17/2018,1:12 PM

## 2018-08-17 NOTE — Progress Notes (Signed)
Late entry  Patient received the scheduled valium with his bed time medication, patient became agitated getting out of bed pulling his IV tubing.  2mg  Ativan given with no success upon reassessment. Patient pulled the IV and " hang his feet over the bed rails attempting to get out of bed. It took the [place]  4 nurses and 3 NTs to keep the patient safe in bed. On call provider notified new order of one time dose of 2 mg ativan and new restraint order received. The tele sitter team notified us that the patient is no longer a  candidate after numerous attempts to get out of bed within the hour, but for safety the tele sitter is left in the room for safety.

## 2018-08-17 NOTE — Progress Notes (Signed)
Pt is alert and calm. cbg is 68, pt treated according to protocol. The tele sitter ist set up we'll continue to monitor.

## 2018-08-17 NOTE — Progress Notes (Signed)
Pt arrived to the floor with the [facility] and two 29M  Nurses on a waist and wrist band. Pt agitated and climbing ort of bed. CHg bath given, heart monitor apply CCMD notified we'll continue to monitor.

## 2018-08-17 NOTE — Progress Notes (Signed)
PCCM Interval Note   Called to patient bedside for increased agitation. CIWA score 15. Received Ativan 1 mg x 2, last dose over an hour ago. Patient currently in waist and wrist restraints and climbing out of the bed.   2 mg ativan @ 0215 Haldol 5 mg @ 0228 1 mg ativan @ 1740  Transfer to Step-Down with increase of CIWA to Ativan q1h with PRN Haldol. Will Obtain EKG and request bedside sitter.   Hayden Pedro, AGACNP-BC [facility]  PCCM Pgr: [phone]

## 2018-08-17 NOTE — Progress Notes (Signed)
Notified next of kin Kermit Arnette (pt's brother) of pt transfer to [id number].

## 2018-08-17 NOTE — Progress Notes (Signed)
Hypoglycemic Event  CBG: 68  Treatment: 8ml d 50n given per protocol  Symptoms: Pt lethargic  Follow-up CBG: Time:0505 CBG Result:152  Possible Reasons for Event: pt NPO and confused   Comments/MD notified:no    Jeremiah Tucker

## 2018-08-18 LAB — CBC
HEMATOCRIT: 33.5 % — AB (ref 39.0–52.0)
HEMOGLOBIN: 10.9 g/dL — AB (ref 13.0–17.0)
MCH: 34 pg (ref 26.0–34.0)
MCHC: 32.5 g/dL (ref 30.0–36.0)
MCV: 104.4 fL — AB (ref 80.0–100.0)
PLATELETS: 169 10*3/uL (ref 150–400)
RBC: 3.21 MIL/uL — ABNORMAL LOW (ref 4.22–5.81)
RDW: 13.5 % (ref 11.5–15.5)
WBC: 7.7 10*3/uL (ref 4.0–10.5)
nRBC: 0 % (ref 0.0–0.2)

## 2018-08-18 LAB — GLUCOSE, CAPILLARY
GLUCOSE-CAPILLARY: 79 mg/dL (ref 70–99)
GLUCOSE-CAPILLARY: 80 mg/dL (ref 70–99)
GLUCOSE-CAPILLARY: 80 mg/dL (ref 70–99)
GLUCOSE-CAPILLARY: 88 mg/dL (ref 70–99)
Glucose-Capillary: 85 mg/dL (ref 70–99)
Glucose-Capillary: 90 mg/dL (ref 70–99)

## 2018-08-18 LAB — COMPREHENSIVE METABOLIC PANEL
ALT: 17 U/L (ref 0–44)
ANION GAP: 13 (ref 5–15)
AST: 26 U/L (ref 15–41)
Albumin: 2.8 g/dL — ABNORMAL LOW (ref 3.5–5.0)
Alkaline Phosphatase: 48 U/L (ref 38–126)
BUN: 16 mg/dL (ref 6–20)
CHLORIDE: 107 mmol/L (ref 98–111)
CO2: 23 mmol/L (ref 22–32)
Calcium: 8.8 mg/dL — ABNORMAL LOW (ref 8.9–10.3)
Creatinine, Ser: 1.21 mg/dL (ref 0.61–1.24)
Glucose, Bld: 80 mg/dL (ref 70–99)
POTASSIUM: 3.4 mmol/L — AB (ref 3.5–5.1)
SODIUM: 143 mmol/L (ref 135–145)
Total Bilirubin: 1.4 mg/dL — ABNORMAL HIGH (ref 0.3–1.2)
Total Protein: 6.4 g/dL — ABNORMAL LOW (ref 6.5–8.1)

## 2018-08-18 LAB — CULTURE, BLOOD (ROUTINE X 2)
Culture: NO GROWTH
Culture: NO GROWTH

## 2018-08-18 LAB — MAGNESIUM: MAGNESIUM: 1.2 mg/dL — AB (ref 1.7–2.4)

## 2018-08-18 MED ORDER — POTASSIUM CHLORIDE CRYS ER 20 MEQ PO TBCR
40.0000 meq | EXTENDED_RELEASE_TABLET | Freq: Once | ORAL | Status: AC
  Administered 2018-08-18: 40 meq via ORAL
  Filled 2018-08-18: qty 2

## 2018-08-18 MED ORDER — HYDRALAZINE HCL 20 MG/ML IJ SOLN
10.0000 mg | Freq: Four times a day (QID) | INTRAMUSCULAR | Status: DC | PRN
  Filled 2018-08-18: qty 1

## 2018-08-18 NOTE — Progress Notes (Signed)
The Nurse Tech is sitting with the patient for safety.

## 2018-08-18 NOTE — Progress Notes (Signed)
Patient is alert and oriented. He is currently resting in bed. Patient is calm, cooperative and following commands. Will continue to monitor patient's behavior.

## 2018-08-18 NOTE — Progress Notes (Signed)
Spoke to Dr. Allyson Sabal about patient's family's concern that he may try to harm himself. Informed her of a conversation that I had with the patient. The patient was alert, calm and non-combative. I asked the patient if he wanted to harm himself and he said, "No I do not want to hurt myself". The patient also talked about how sad he was about his wife's death.

## 2018-08-18 NOTE — Progress Notes (Signed)
PT Cancellation Note  Patient Details Name: Jeremiah Tucker MRN: [id number] DOB: Jul 12, 1961   Cancelled Treatment:    Reason Eval/Treat Not Completed: Patient not medically ready Per nursing staff, hold therapies at this time due to patient behaviors. Will follow.   Lanney Gins, PT, DPT Supplemental Physical Therapist 08/18/18 8:31 AM Pager: [phone] Office: [phone]

## 2018-08-18 NOTE — Progress Notes (Signed)
  Speech Language Pathology Treatment: Dysphagia  Patient Details Name: Jeremiah Tucker MRN: [id number] DOB: Mar 19, 1961 Today's Date: 08/18/2018 Time: 4235-3614 SLP Time Calculation (min) (ACUTE ONLY): 9 min  Assessment / Plan / Recommendation Clinical Impression  Pt has multiple swallows with thin liquids, but does not exhibit any overt s/s of aspiration across even larger volumes of thin liquids. When given bites of puree, he has prolonged oral transit, possible delayed swallow, with a delayed cough that started after multiple bites. Question potential residuals. Pt at the moment is in restraints due to events overnight, with mentation likely still fluctuating. Recommend maintaining NPO status except for ice chips and small, single sips of water when fully upright and alert. Would provide meds crushed in small amount of puree. Given improvements in mentation and reduced s/s of aspiration compared to previous date, anticipate rapid improvement of likely post-extubation dysphagia. If he still presents with s/s of aspiration on next date, will consider instrumental testing.   HPI HPI: 57 year old male with h/o of hypertension and alcohol abuse was brought to the emergency room after being found obtunded, minimally responsive, was suspected to be secondary to drug overdose, benzos noted in urine drug screen, alcohol intoxication, intubated on 11/13, extubated 11/15. Now in DTs.       SLP Plan  Continue with current plan of care       Recommendations  Diet recommendations: NPO;Other(comment)(ice chips, small sips of water) Medication Administration: Crushed with puree Supervision: Staff to assist with self feeding;Full supervision/cueing for compensatory strategies Compensations: Slow rate;Small sips/bites;Minimize environmental distractions Postural Changes and/or Swallow Maneuvers: Seated upright 90 degrees                Oral Care Recommendations: Oral care QID;Oral care prior to ice  chip/H20 Follow up Recommendations: Other (comment)(tba) SLP Visit Diagnosis: Dysphagia, pharyngeal phase (R13.13) Plan: Continue with current plan of care       GO                Germain Osgood 08/18/2018, 12:14 PM  Germain Osgood, M.A. [place] Acute Environmental education officer [phone] Office [phone]

## 2018-08-18 NOTE — Progress Notes (Signed)
[facility] PROGRESS NOTE  Jeremiah Tucker RUE:[id number] DOB: 1960/11/20 DOA: 08/13/2018   PCP: [name], MD     Assessment/Plan: Active Problems:   Acute encephalopathy   Malnutrition of moderate degree  57 year old male admitted on 08/13/18 after being found minimally responsive and noted to be hypoxemic in the ED requiring intubation and admission to ICU. Initially thought to have acute metabolic encephalopathy, chest x-ray showed left basilar airspace disease concerning for aspiration. Urine drug screen positive for benzodiazepines. Family reported heavy alcohol abuse. Was sedated with propofol in the ICU and fentanyl as needed, extubated 11/15  - 11/16 he was transferred to the floor , subsequently became severely agitated, acute alcohol withdrawal/DTs, was given 8 mg of IV Ativan, 5 mg of Haldol    -He was [facility] today 11/17  11/18  Continues to have  Sitter by bedside, received scheduled Valium. Pulled her IV out and attempting to get out of bed.   Assessment and plan Acute encephalopathy -Initially secondary to benzo overdose, alcohol intoxication now with withdrawal/DTs -Required mechanical ventilation 11/13 to 11/15 -Severe depression, reported history of recent demise of his wife -severe agitation  - now receiving  scheduled Valium, prn Ativan, and Haldol -If agitation becomes very difficult to manage on stepdown I will transfer him back to the ICU -Will ask psych to evaluate once mental status significantly improved Evaluated by speech therapy,  Not safe to eat and  hence continues to be npo    Alcohol withdrawal -as Above    Malnutrition of moderate degree -add supplements when possible  Hypomagnesemia -Replaced , recheck in the morning  Essential hypertension -Home meds on hold at this time  Intermittent hypoglycemia Suspect this is due liver dysfunction in the setting of alcohol use, continue  Accu-Cheks  macrocytic anemia-hemoglobin stable  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 These are well circumscribed with heterogeneous enhancement. Review of prior studies reveals that at least the larger lesion is unchanged from 2017. The lesions are separate from the muscles and optic nerve. 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  hypertension-started patient on prn hydralazine will also start patient on low-dose lisinopril     DVT prophylaxsis  heparin  Code Status:   Full code   Family Communication: Discussed in detail with the patient, all imaging results, lab results explained to the patient   Disposition Plan:  Continues to remain in stepdown remains agitated,     Consultants:   critical care  Procedures:   none  Antibiotics: Anti-infectives (From admission, onward)   Start     Dose/Rate Route Frequency Ordered Stop   08/14/18 1200  Ampicillin-Sulbactam (UNASYN) 3 g in sodium chloride 0.9 % 100 mL IVPB  Status:  Discontinued     3 g 200 mL/hr over 30 Minutes Intravenous Every 8 hours 08/14/18 1029 08/15/18 0910   08/13/18 1730  piperacillin-tazobactam (ZOSYN) IVPB 3.375 g     3.375 g 100 mL/hr over 30 Minutes Intravenous  Once 08/13/18 1718 08/14/18 0831         HPI/Subjective: Confused, mumbling, doesn't know why he is in the hospital,remained agitated last night requiring restraints and tele  sitter  Objective: Vitals:   08/17/18 2326 08/18/18 0016 08/18/18 0410 08/18/18 0803  BP: 135/64 (!) 146/68 (!) 161/71 (!) 179/63  Pulse:  84  84  Resp: (!) 26 (!) 24 17 (!)  21  Temp:  98.8 F (37.1 C) 98.7 F (37.1 C) (!) 97.5 F (36.4 C)  TempSrc:  Oral Axillary Oral  SpO2: 100% 100% 100% 100%  Weight:      Height:        Intake/Output Summary (Last 24 hours) at 08/18/2018 3846 Last data filed at 08/18/2018 0406 Gross per 24 hour  Intake -  Output 1050 ml  Net -1050 ml     Exam:  Examination:  General exam:  Confused  Respiratory system: Clear to auscultation. Respiratory effort normal. Cardiovascular system: S1 & S2 heard, RRR. No JVD, murmurs, rubs, gallops or clicks. No pedal edema. Gastrointestinal system: Abdomen is nondistended, soft and nontender. No organomegaly or masses felt. Normal bowel sounds heard. Central nervous system: awake. No focal neurological deficits. Extremities:  restrained Skin: No rashes, lesions or ulcers Psychiatry:  impaired    Data Reviewed: I have personally reviewed following labs and imaging studies  Micro Results Recent Results (from the past 240 hour(s))  Blood culture (routine x 2)     Status: None (Preliminary result)   Collection Time: 08/13/18  5:45 PM  Result Value Ref Range Status   Specimen Description BLOOD RIGHT ANTECUBITAL  Final   Special Requests   Final    BOTTLES DRAWN AEROBIC AND ANAEROBIC Blood Culture results may not be optimal due to an excessive volume of blood received in culture bottles   Culture   Final    NO GROWTH 4 DAYS Performed at [facility], [place] [address], [place], [place] [address]    Report Status PENDING  Incomplete  Blood culture (routine x 2)     Status: None (Preliminary result)   Collection Time: 08/13/18  6:00 PM  Result Value Ref Range Status   Specimen Description BLOOD RIGHT HAND  Final   Special Requests   Final    BOTTLES DRAWN AEROBIC AND ANAEROBIC Blood Culture results may not be optimal due to an excessive volume of blood received in culture bottles   Culture   Final    NO GROWTH 4 DAYS Performed at [facility], [place] [address] [place], [place] [address]    Report Status PENDING  Incomplete  MRSA PCR Screening     Status: None   Collection Time: 08/13/18  6:52 PM  Result Value Ref Range Status   MRSA by PCR NEGATIVE NEGATIVE Final    Comment:        The GeneXpert MRSA Assay (FDA approved for NASAL specimens only), is one component of  a comprehensive MRSA colonization surveillance program. It is not intended to diagnose MRSA infection nor to guide or monitor treatment for MRSA infections. Performed at [facility], [place] [address] [place],  [address]     Radiology Reports Ct Head Wo Contrast  Result Date: 08/13/2018 CLINICAL DATA:  Altered mental status. EXAM: CT HEAD WITHOUT CONTRAST TECHNIQUE: Contiguous axial images were obtained from the base of the skull through the vertex without intravenous contrast. COMPARISON:  12/31/2015 FINDINGS: Brain: No evidence of acute infarction, hemorrhage, hydrocephalus, extra-axial collection or mass lesion/mass effect. There is ventricular and sulcal enlargement reflecting atrophy advanced for age. Vascular: No hyperdense vessel or unexpected calcification. Skull: Normal. Negative for fracture or focal lesion. Sinuses/Orbits: Homogeneous, 16 x 10 x 12 mm mass in the left orbit abutting the inferior rectus. Globes and orbits otherwise unremarkable. Mild left maxillary sinus mucosal thickening. Mild right ethmoid and left maxillary sinus mucosal thickening. Clear mastoid  air cells. Right-sided nasal cannula. Other: None. IMPRESSION: 1. No acute intracranial abnormalities. 2. Atrophy advanced for age. 3. 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 It measures 16 mm in greatest dimension. This would be better characterized with orbital MRI with and without contrast. Electronically Signed   By: Lajean Manes M.D.   On: 08/13/2018 15:27   Dg Chest Port 1 View  Result Date: 08/15/2018 CLINICAL DATA:  Hypoxia EXAM: PORTABLE CHEST 1 VIEW COMPARISON:  August 13, 2018 FINDINGS: Endotracheal tube tip is 5.0 cm above the carina. Nasogastric tube tip is in the stomach with the side port essentially at the gastroesophageal junction. No pneumothorax. No edema or consolidation. Heart size and pulmonary vascularity are normal. No adenopathy.  There is aortic atherosclerosis. There old healed rib fractures on the right. IMPRESSION: Tube and catheter positions as described without pneumothorax. Nasogastric tube side port is at the gastroesophageal junction. Advise advancing nasogastric tube 4-5 cm to ensure both tube tip and side port are within the stomach. No edema or consolidation. Heart size normal. There is aortic atherosclerosis. Aortic Atherosclerosis (ICD10-I70.0). Electronically Signed   By: Lowella Grip III M.D.   On: 08/15/2018 07:29   Dg Chest Portable 1 View  Result Date: 08/13/2018 CLINICAL DATA:  Status post intubation EXAM: PORTABLE CHEST 1 VIEW COMPARISON:  08/13/2018 FINDINGS: Endotracheal tube with the tip 3.5 cm above the carina. Nasogastric tube coursing below the diaphragm. Elevation of the left diaphragm. Mild left basilar airspace disease likely reflecting atelectasis versus pneumonia. No focal consolidation. No pleural effusion or pneumothorax. Stable cardiomediastinal silhouette. No acute osseous abnormality. IMPRESSION: 1. Endotracheal tube with the tip 3.5 cm of the the carina. Nasogastric tube coursing below the diaphragm. 2. Mild left basilar airspace disease which may reflect atelectasis versus pneumonia. Electronically Signed   By: Kathreen Devoid   On: 08/13/2018 17:11   Dg Chest Portable 1 View  Result Date: 08/13/2018 CLINICAL DATA:  Did dyspnea and altered mentation EXAM: PORTABLE CHEST 1 VIEW COMPARISON:  05/19/2013 FINDINGS: Emphysematous hyperinflation of the lungs. Normal heart size. Aortic atherosclerosis at the arch without aneurysm. Remote right-sided sixth and seventh rib fractures with healing. No acute osseous abnormality. No pulmonary consolidation or edema. Blunting of the left lateral costophrenic angle is noted, nonspecific but may reflect an area of atelectasis, scarring and/or trace effusion. IMPRESSION: 1. Pulmonary hyperinflation. 2. Blunting of the left lateral costophrenic angle as above  possibly related to atelectasis, scarring and/or trace effusion. 3. Aortic atherosclerosis. Electronically Signed   By: Ashley Royalty M.D.   On: 08/13/2018 13:38   Mr Rosealee Albee GN Contrast  Result Date: 08/14/2018 CLINICAL DATA:  Mass lesion left orbit EXAM: MRI OF THE ORBITS WITHOUT AND WITH CONTRAST TECHNIQUE: Multiplanar, multisequence MR imaging of the orbits was performed both before and after the administration of intravenous contrast. CONTRAST:  6 mL Gadovist IV COMPARISON:  CT head 08/13/2018, 12/31/2015 FINDINGS: Two mass lesions in the left inferior orbit. These are well circumscribed and homogeneous low signal on T1 and high signal on T2. Larger proximal lesion is intraconal and lateral and superior to the inferior rectus. The smaller anterior lesion is located immediately lateral to the inferior rectus. The lesion show mild-to-moderate heterogeneous enhancement. The larger proximal lesion measures 10 x 14 mm and the smaller distal lesion measures 9 mm in diameter. These are not associated with the optic nerve. Extraocular muscles normal. Optic nerve normal. No bony changes identified. The globe is  normal bilaterally. Orbital fat otherwise normal. Lacrimal gland normal. Optic chiasm normal.  Cavernous sinus normal bilaterally. Limited intracranial imaging reveals moderate atrophy. 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 Mucosal edema paranasal sinuses without air-fluid level. IMPRESSION: Two masses lesions in the left inferior orbit are nearly contiguous. These are well circumscribed with heterogeneous enhancement. Review of prior studies reveals that at least the larger lesion is unchanged from 2017. The lesions are separate from the muscles and optic nerve. 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 Aggressive tumor such as metastatic disease not considered likely given the stability over time. Hemangioma considered less likely. No other lesions. Electronically  Signed   By: Franchot Gallo M.D.   On: 08/14/2018 18:16     CBC Recent Labs  Lab 08/13/18 1328 08/13/18 1336 08/14/18 0321 08/15/18 0610 08/18/18 0259  WBC 6.4  --  15.3* 15.9* 7.7  HGB 13.2 13.9 13.2 12.0* 10.9*  HCT 40.3 41.0 41.3 36.4* 33.5*  PLT 207  --  197 152 169  MCV 104.4*  --  104.3* 103.1* 104.4*  MCH 34.2*  --  33.3 34.0 34.0  MCHC 32.8  --  32.0 33.0 32.5  RDW 13.8  --  13.8 14.6 13.5  LYMPHSABS 1.9  --   --   --   --   MONOABS 0.8  --   --   --   --   EOSABS 0.1  --   --   --   --   BASOSABS 0.1  --   --   --   --     Chemistries  Recent Labs  Lab 08/13/18 1328  08/14/18 0321 08/14/18 0814 08/14/18 1613 08/15/18 0610 08/16/18 0225 08/17/18 0624 08/18/18 0259  NA 138   < > 138  --   --  142 144 145 143  K 4.5   < > 4.6  --   --  4.0 3.5 3.6 3.4*  CL 104   < > 107  --   --  105 107 107 107  CO2 21*  --  15*  --   --  26 27 25 23   GLUCOSE 106*   < > 146*  --   --  113* 88 87 80  BUN 38*   < > 35*  --   --  42* 24* 18 16  CREATININE 1.49*   < > 1.38*  --   --  1.51* 1.13 1.17 1.21  CALCIUM 9.3  --  8.9  --   --  8.8* 9.1 8.9 8.8*  MG  --    < > 1.0* 1.6* 2.0 1.9 1.2* 1.6*  --   AST 35  --   --   --   --  24  --   --  26  ALT 32  --   --   --   --  18  --   --  17  ALKPHOS 41  --   --   --   --  33*  --   --  48  BILITOT 1.0  --   --   --   --  0.5  --   --  1.4*   < > = values in this interval not displayed.   ------------------------------------------------------------------------------------------------------------------ estimated creatinine clearance is 60.4 mL/min (by C-G formula based on SCr of 1.21 mg/dL). ------------------------------------------------------------------------------------------------------------------ No results for input(s): HGBA1C in the last 72 hours. ------------------------------------------------------------------------------------------------------------------ No results for input(s): CHOL, HDL, LDLCALC,  TRIG, CHOLHDL,  LDLDIRECT in the last 72 hours. ------------------------------------------------------------------------------------------------------------------ No results for input(s): TSH, T4TOTAL, T3FREE, THYROIDAB in the last 72 hours.  Invalid input(s): FREET3 ------------------------------------------------------------------------------------------------------------------ No results for input(s): VITAMINB12, FOLATE, FERRITIN, TIBC, IRON, RETICCTPCT in the last 72 hours.  Coagulation profile No results for input(s): INR, PROTIME in the last 168 hours.  No results for input(s): DDIMER in the last 72 hours.  Cardiac Enzymes No results for input(s): CKMB, TROPONINI, MYOGLOBIN in the last 168 hours.  Invalid input(s): CK ------------------------------------------------------------------------------------------------------------------ Invalid input(s): POCBNP   CBG: Recent Labs  Lab 08/17/18 1556 08/17/18 2020 08/18/18 0012 08/18/18 0403 08/18/18 0800  GLUCAP 77 68* 80 88 80       Studies: No results found.    No results found for: HGBA1C Lab Results  Component Value Date   CREATININE 1.21 08/18/2018       Scheduled Meds: . diazepam  2.5 mg Intravenous BID   Or  . diazepam  5 mg Oral BID  . folic acid  1 mg Intravenous Daily  . heparin  5,000 Units Subcutaneous Q8H  . insulin aspart  0-15 Units Subcutaneous Q4H  . LORazepam  2 mg Intravenous Once  . mouth rinse  15 mL Mouth Rinse BID  . potassium chloride  40 mEq Oral Once  . thiamine injection  100 mg Intravenous Daily   Continuous Infusions: . lactated ringers 50 mL/hr at 08/17/18 2030     LOS: 5 days    Time spent: >30 MINS    Reyne Dumas  [facility] Pager [phone]. If 7PM-7AM, please contact night-coverage at [email or web address], password [facility] 08/18/2018, 8:08 AM  LOS: 5 days

## 2018-08-19 ENCOUNTER — Inpatient Hospital Stay (HOSPITAL_COMMUNITY): Payer: BLUE CROSS/BLUE SHIELD

## 2018-08-19 DIAGNOSIS — E440 Moderate protein-calorie malnutrition: Secondary | ICD-10-CM

## 2018-08-19 LAB — BASIC METABOLIC PANEL
ANION GAP: 11 (ref 5–15)
BUN: 15 mg/dL (ref 6–20)
CHLORIDE: 106 mmol/L (ref 98–111)
CO2: 25 mmol/L (ref 22–32)
Calcium: 8.9 mg/dL (ref 8.9–10.3)
Creatinine, Ser: 1.11 mg/dL (ref 0.61–1.24)
GFR calc Af Amer: >60 mL/min (ref >60)
GFR calc non Af Amer: >60 mL/min (ref >60)
GLUCOSE: 92 mg/dL (ref 70–99)
POTASSIUM: 3.4 mmol/L — AB (ref 3.5–5.1)
Sodium: 142 mmol/L (ref 135–145)

## 2018-08-19 LAB — GLUCOSE, CAPILLARY
GLUCOSE-CAPILLARY: 94 mg/dL (ref 70–99)
GLUCOSE-CAPILLARY: 87 mg/dL (ref 70–99)
GLUCOSE-CAPILLARY: 113 mg/dL — AB (ref 70–99)
GLUCOSE-CAPILLARY: 101 mg/dL — AB (ref 70–99)
Glucose-Capillary: 115 mg/dL — ABNORMAL HIGH (ref 70–99)
Glucose-Capillary: 99 mg/dL (ref 70–99)

## 2018-08-19 LAB — MAGNESIUM: Magnesium: 1.1 mg/dL — ABNORMAL LOW (ref 1.7–2.4)

## 2018-08-19 MED ORDER — INFLUENZA VAC SPLIT QUAD 0.5 ML IM SUSY
0.5000 mL | PREFILLED_SYRINGE | INTRAMUSCULAR | Status: DC
  Filled 2018-08-19: qty 0.5

## 2018-08-19 MED ORDER — MAGNESIUM SULFATE 4 GM/100ML IV SOLN
4.0000 g | Freq: Once | INTRAVENOUS | Status: AC
  Administered 2018-08-19: 4 g via INTRAVENOUS
  Filled 2018-08-19: qty 100

## 2018-08-19 MED ORDER — MAGNESIUM SULFATE 2 GM/50ML IV SOLN
2.0000 g | Freq: Once | INTRAVENOUS | Status: AC
  Administered 2018-08-19: 2 g via INTRAVENOUS
  Filled 2018-08-19: qty 50

## 2018-08-19 MED ORDER — ENSURE ENLIVE PO LIQD
237.0000 mL | Freq: Three times a day (TID) | ORAL | Status: DC
  Administered 2018-08-19 – 2018-08-25 (×13): 237 mL via ORAL

## 2018-08-19 MED ORDER — POTASSIUM CHLORIDE CRYS ER 20 MEQ PO TBCR
40.0000 meq | EXTENDED_RELEASE_TABLET | Freq: Two times a day (BID) | ORAL | Status: DC
  Administered 2018-08-19 – 2018-08-20 (×3): 40 meq via ORAL
  Filled 2018-08-19 (×4): qty 2

## 2018-08-19 NOTE — Progress Notes (Signed)
Modified Barium Swallow Progress Note  Patient Details  Name: Jeremiah Tucker MRN: [id number] Date of Birth: 04-Nov-1960  Today's Date: 08/19/2018  Modified Barium Swallow completed.  Full report located under Chart Review in the Imaging Section.  Brief recommendations include the following:  Clinical Impression  Pt has a moderate oropharyngeal dysphagia felt to be related to generalized deconditioning as well as suspected esophageal component. Pt has weak lingual manipulation and decreased bolus cohesion, with lingual residue that increases with more solid consistencies but is cleared with Min cues for a dry swallow. Pt also has reduced hyolaryngeal movement, base of tongue retraction, and pharyngeal squeeze, also resulting in reduced UES relaxation. All of this impacts his pharyngeal clearance, leaving behind moderate residue at the valleculae with all consistencies, also in the pyriform sinuses with thin liquids. A second swallow also helps to clear pharyngeal residue. He has occasional flash penetration with thin liquids during the swallow, but aspiration only occurred x1 when trying to take a pill with thin liquid. Although he did cough in response, it was too delayed (almost 30 seconds late) and too weak to clear the airway. Recommend starting Dys 2 diet and thin liquids with meds whole in puree. He will likely need full supervision to monitor for impulsivity and use of second swallow.    Swallow Evaluation Recommendations       SLP Diet Recommendations: Dysphagia 2 (Fine chop) solids;Thin liquid   Liquid Administration via: Cup;Straw   Medication Administration: Whole meds with puree   Supervision: Patient able to self feed;Full supervision/cueing for compensatory strategies   Compensations: Slow rate;Small sips/bites;Minimize environmental distractions;Multiple dry swallows after each bite/sip   Postural Changes: Seated upright at 90 degrees;Remain semi-upright after after  feeds/meals (Comment)   Oral Care Recommendations: Oral care BID        Germain Osgood 08/19/2018,1:39 PM   Germain Osgood, M.A. [place] Acute Environmental education officer [phone] Office [phone]

## 2018-08-19 NOTE — Evaluation (Signed)
Physical Therapy Evaluation Patient Details Name: Jeremiah Tucker MRN: [id number] DOB: 13-Feb-1961 Today's Date: 08/19/2018   History of Present Illness  57 year old male with h/o of hypertension and alcohol abuse was brought to the emergency room after being found obtunded, minimally responsive, was suspected to be secondary to drug overdose, benzos noted in urine drug screen, alcohol intoxication, intubated on 11/13, extubated 11/15. Now in DTs.  Clinical Impression  Orders received for PT evaluation. Patient demonstrates deficits in functional mobility as indicated below. Will benefit from continued skilled PT to address deficits and maximize function. Will see as indicated and progress as tolerated.  PATIENT is NOT SAFE for d/c home alone in current condition. If family is able to provide 24/7 hands on physical assist then may consider home with HHPT, but feel SNF is safest disposition given current fall risk and functional status. Will follow progress and update recommendations if patient improves.    Follow Up Recommendations Supervision/Assistance - 24 hour;SNF(unless family can provide 24/7 assist)    Equipment Recommendations  (TBD)    Recommendations for Other Services       Precautions / Restrictions Precautions Precautions: Fall      Mobility  Bed Mobility Overal bed mobility: Needs Assistance Bed Mobility: Rolling;Supine to Sit;Sit to Supine Rolling: Supervision   Supine to sit: Min assist Sit to supine: Min assist   General bed mobility comments: patient required physical assist as he was unable to sequence through task of positioning himself in bed despite increased time and multimodal cues.   Transfers Overall transfer level: Needs assistance Equipment used: 1 person hand held assist Transfers: Sit to/from Stand Sit to Stand: Min assist         General transfer comment: Min assist for stability, good power up but poor proprioceptive awareness and noted  truncal ataxia in standing  Ambulation/Gait Ambulation/Gait assistance: Min assist Gait Distance (Feet): 30 Feet Assistive device: 1 person hand held assist;IV Pole Gait Pattern/deviations: Ataxic Gait velocity: decreased   General Gait Details: patient with noted instability, high fall risk. required hands on physical assist to prevent fall due to several LOB.   Stairs            Wheelchair Mobility    Modified Rankin (Stroke Patients Only)       Balance Overall balance assessment: Needs assistance Sitting-balance support: Feet supported Sitting balance-Leahy Scale: Fair     Standing balance support: During functional activity Standing balance-Leahy Scale: Poor Standing balance comment: poor ability to maintain upright                             Pertinent Vitals/Pain Pain Assessment: Faces Faces Pain Scale: No hurt    Home Living Family/patient expects to be discharged to:: Private residence Living Arrangements: Alone Available Help at Discharge: Family(nephew and mom) Type of Home: Mobile home Home Access: Stairs to enter Entrance Stairs-Rails: Can reach both Entrance Stairs-Number of Steps: 7 Home Layout: One level Home Equipment: None      Prior Function Level of Independence: Independent               Hand Dominance   Dominant Hand: Right    Extremity/Trunk Assessment                Communication   Communication: No difficulties  Cognition Arousal/Alertness: Awake/alert Behavior During Therapy: WFL for tasks assessed/performed Overall Cognitive Status: Impaired/Different from baseline Area of Impairment: Orientation;Attention;Memory;Following commands;Awareness;Safety/judgement;Problem  solving                 Orientation Level: Disoriented to;Situation Current Attention Level: Selective Memory: Decreased recall of precautions;Decreased short-term memory Following Commands: Follows one step commands  inconsistently;Follows one step commands with increased time;Follows multi-step commands inconsistently Safety/Judgement: Decreased awareness of safety;Decreased awareness of deficits Awareness: Emergent Problem Solving: Slow processing;Decreased initiation;Difficulty sequencing;Requires verbal cues;Requires tactile cues        General Comments      Exercises     Assessment/Plan    PT Assessment Patient needs continued PT services  PT Problem List Decreased strength;Decreased activity tolerance;Decreased balance;Decreased mobility;Decreased coordination;Decreased cognition;Decreased safety awareness       PT Treatment Interventions DME instruction;Gait training;Stair training;Functional mobility training;Therapeutic activities;Therapeutic exercise;Balance training;Cognitive remediation;Patient/family education    PT Goals (Current goals can be found in the Care Plan section)  Acute Rehab PT Goals Patient Stated Goal: to go home tomorrow PT Goal Formulation: With patient Time For Goal Achievement: 09/02/18 Potential to Achieve Goals: Fair    Frequency Min 3X/week   Barriers to discharge Inaccessible home environment;Decreased caregiver support has 8 steps and lives alone    Co-evaluation               AM-PAC PT "6 Clicks" Daily Activity  Outcome Measure Difficulty turning over in bed (including adjusting bedclothes, sheets and blankets)?: Unable Difficulty moving from lying on back to sitting on the side of the bed? : Unable Difficulty sitting down on and standing up from a chair with arms (e.g., wheelchair, bedside commode, etc,.)?: Unable Help needed moving to and from a bed to chair (including a wheelchair)?: A Little Help needed walking in hospital room?: A Little Help needed climbing 3-5 steps with a railing? : A Lot 6 Click Score: 11    End of Session Equipment Utilized During Treatment: Gait belt Activity Tolerance: Patient tolerated treatment  well Patient left: in bed;with call bell/phone within reach;with bed alarm set Nurse Communication: Mobility status PT Visit Diagnosis: Unsteadiness on feet (R26.81);Difficulty in walking, not elsewhere classified (R26.2);Other symptoms and signs involving the nervous system (R29.898)    Time: 2706-2376 PT Time Calculation (min) (ACUTE ONLY): 17 min   Charges:   PT Evaluation $PT Eval Moderate Complexity: 1 Mod          Alben Deeds, PT DPT  Board Certified Neurologic Specialist Acute Rehabilitation Services Pager [phone] Office [phone]   Duncan Dull 08/19/2018, 11:53 AM

## 2018-08-19 NOTE — Progress Notes (Signed)
   08/19/18 1100  Clinical Encounter Type  Visited With Patient  Visit Type Initial  Referral From Nurse  Consult/Referral To Chaplain  Spiritual Encounters  Spiritual Needs Emotional;Grief support;Prayer  Stress Factors  Patient Stress Factors Exhausted;Major life changes;Loss   Director of floor requested a visit to PT. PT was alert and seemed like he wanted to talk. PT was emotional and I noticed his receptiveness as we talked. I offered spiritual care with a listening ear as he shared the recent events of his wife. Also, I offered words of encouragement after he shared stories of his wife. And I offered prayer.  He said he felt so much better now, and smiled after the [place] visit. Chaplain available as needed.   Chaplain Fidel Levy [phone]

## 2018-08-19 NOTE — Progress Notes (Signed)
  Speech Language Pathology Treatment: Dysphagia  Patient Details Name: Jeremiah Tucker MRN: [id number] DOB: Nov 07, 1960 Today's Date: 08/19/2018 Time: 0539-7673 SLP Time Calculation (min) (ACUTE ONLY): 11 min  Assessment / Plan / Recommendation Clinical Impression  Upon SLP arrival, pt was laying reclined in bed with a cup of ice chips, with melted ice across his chest and wet coughing noted. SLP intervened to help with positioning and pacing to reduce but not eliminate s/s of possible aspiration. Given persistent concern for dysphagia, recommend proceeding with MBS today. Would only offer meds crushed in puree and a few single ice chips after oral care but only under full supervision.   HPI HPI: 57 year old male with h/o of hypertension and alcohol abuse was brought to the emergency room after being found obtunded, minimally responsive, was suspected to be secondary to drug overdose, benzos noted in urine drug screen, alcohol intoxication, intubated on 11/13, extubated 11/15. Now in DTs.       SLP Plan  Continue with current plan of care       Recommendations  Diet recommendations: NPO;Other(comment)(ice chips under full supervision) Medication Administration: Crushed with puree Supervision: Staff to assist with self feeding;Full supervision/cueing for compensatory strategies Compensations: Slow rate;Small sips/bites;Minimize environmental distractions Postural Changes and/or Swallow Maneuvers: Seated upright 90 degrees                Oral Care Recommendations: Oral care QID;Oral care prior to ice chip/H20 Follow up Recommendations: Other (comment)(tba) SLP Visit Diagnosis: Dysphagia, pharyngeal phase (R13.13) Plan: Continue with current plan of care       GO                Germain Osgood 08/19/2018, 9:03 AM  Germain Osgood, M.A. [facility] Environmental education officer [phone] Office [phone]

## 2018-08-19 NOTE — Progress Notes (Signed)
[facility] PROGRESS NOTE  Jeremiah Tucker:[id number] DOB: [birth year] DOA: 08/13/2018   PCP: [name], MD     Assessment/Plan: Active Problems:   Acute encephalopathy   Malnutrition of moderate degree  57 year old male admitted on 08/13/18 after being found minimally responsive and noted to be hypoxemic in the ED requiring intubation and admission to ICU. Initially thought to have acute metabolic encephalopathy, chest x-ray showed left basilar airspace disease concerning for aspiration. Urine drug screen positive for benzodiazepines. Family reported heavy alcohol abuse. Was sedated with propofol in the ICU and fentanyl as needed, extubated 11/15  - 11/16 he was transferred to the floor , subsequently became severely agitated, acute alcohol withdrawal/DTs, was given 8 mg of IV Ativan, 5 mg of Haldol    -He was [facility] today 11/17  11/18  Continues to have  Sitter by bedside, received scheduled Valium. Pulled her IV out and attempting to get out of bed.   Assessment and plan Acute encephalopathy -Initially secondary to benzo overdose, alcohol intoxication now with withdrawal/DTs -Required mechanical ventilation 11/13 to 11/15 -Severe depression, reported history of recent demise of his wife -severe agitation  - now receiving  scheduled Valium, prn Ativan, and Haldol -If agitation becomes very difficult to manage on stepdown  transfer him back to the ICU -he will need outpatient psychiatric evaluation , no indication for urgent psychiatric evaluation as patient  was stable overnight from a behavioral standpoint, no major behavioral issues overnight Evaluated by speech therapy,  Not safe to eat and  hence continues to be NPO,  Approved for started diet 11/19 Dysphagia 2 (Fine chop) solids;Thin liquid    Alcohol withdrawal -as Above    Malnutrition of moderate degree -add supplements when possible  Hypomagnesemia  1.1,  will replete aggressively today and repeat in 2-3 days  Essential hypertension -Home meds on hold at this time  Intermittent hypoglycemia Suspect this is due liver dysfunction in the setting of alcohol use, continue Accu-Cheks  macrocytic anemia-hemoglobin stable  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 These are well circumscribed with heterogeneous enhancement. Review of prior studies reveals that at least the larger lesion is unchanged from 2017. The lesions are separate from the muscles and optic nerve. 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  hypertension-started patient on prn hydralazine will also start patient on low-dose lisinopril      DVT prophylaxsis  heparin  Code Status:   Full code   Family Communication: Discussed in detail with the patient, all imaging results, lab results explained to the patient   Disposition Plan:  Continues to remain in stepdown  Due to increased level of nursing care needed,     Consultants:   critical care  Procedures:   none  Antibiotics: Anti-infectives (From admission, onward)   Start     Dose/Rate Route Frequency Ordered Stop   08/14/18 1200  Ampicillin-Sulbactam (UNASYN) 3 g in sodium chloride 0.9 % 100 mL IVPB  Status:  Discontinued     3 g 200 mL/hr over 30 Minutes Intravenous Every 8 hours 08/14/18 1029 08/15/18 0910   08/13/18 1730  piperacillin-tazobactam (ZOSYN) IVPB 3.375 g     3.375 g 100 mL/hr over 30 Minutes Intravenous  Once 08/13/18 1718 08/14/18 0831         HPI/Subjective: Much more calm and composed overnight   Objective: Vitals:   08/18/18  0803 08/18/18 2000 08/18/18 2359 08/19/18 0412  BP: (!) 179/63 (!) 147/75 (!) 133/53 125/64  Pulse: 84 82 77 80  Resp: (!) 21  18 (!) 26  Temp: (!) 97.5 F (36.4 C)  99.3 F (37.4 C) 98.6 F (37 C)  TempSrc: Oral  Oral Oral  SpO2: 100%  100% 99%  Weight:    66 kg  Height:         Intake/Output Summary (Last 24 hours) at 08/19/2018 0836 Last data filed at 08/19/2018 0415 Gross per 24 hour  Intake -  Output 1025 ml  Net -1025 ml    Exam:  Examination:  General exam:  Confused  Respiratory system: Clear to auscultation. Respiratory effort normal. Cardiovascular system: S1 & S2 heard, RRR. No JVD, murmurs, rubs, gallops or clicks. No pedal edema. Gastrointestinal system: Abdomen is nondistended, soft and nontender. No organomegaly or masses felt. Normal bowel sounds heard. Central nervous system: awake. No focal neurological deficits. Extremities:  restrained Skin: No rashes, lesions or ulcers Psychiatry:  impaired    Data Reviewed: I have personally reviewed following labs and imaging studies  Micro Results Recent Results (from the past 240 hour(s))  Blood culture (routine x 2)     Status: None   Collection Time: 08/13/18  5:45 PM  Result Value Ref Range Status   Specimen Description BLOOD RIGHT ANTECUBITAL  Final   Special Requests   Final    BOTTLES DRAWN AEROBIC AND ANAEROBIC Blood Culture results may not be optimal due to an excessive volume of blood received in culture bottles   Culture   Final    NO GROWTH 5 DAYS Performed at [facility], [place] [address], [place], [place] [address]    Report Status 08/18/2018 FINAL  Final  Blood culture (routine x 2)     Status: None   Collection Time: 08/13/18  6:00 PM  Result Value Ref Range Status   Specimen Description BLOOD RIGHT HAND  Final   Special Requests   Final    BOTTLES DRAWN AEROBIC AND ANAEROBIC Blood Culture results may not be optimal due to an excessive volume of blood received in culture bottles   Culture   Final    NO GROWTH 5 DAYS Performed at [facility], [place] [address] [place], [place] [address]    Report Status 08/18/2018 FINAL  Final  MRSA PCR Screening     Status: None   Collection Time: 08/13/18  6:52 PM  Result Value Ref Range Status   MRSA by PCR NEGATIVE  NEGATIVE Final    Comment:        The GeneXpert MRSA Assay (FDA approved for NASAL specimens only), is one component of a comprehensive MRSA colonization surveillance program. It is not intended to diagnose MRSA infection nor to guide or monitor treatment for MRSA infections. Performed at [facility], [place] [address] [place], [place] [address]     Radiology Reports Ct Head Wo Contrast  Result Date: 08/13/2018 CLINICAL DATA:  Altered mental status. EXAM: CT HEAD WITHOUT CONTRAST TECHNIQUE: Contiguous axial images were obtained from the base of the skull through the vertex without intravenous contrast. COMPARISON:  12/31/2015 FINDINGS: Brain: No evidence of acute infarction, hemorrhage, hydrocephalus, extra-axial collection or mass lesion/mass effect. There is ventricular and sulcal enlargement reflecting atrophy advanced for age. Vascular: No hyperdense vessel or unexpected calcification. Skull: Normal. Negative for fracture or focal lesion. Sinuses/Orbits: Homogeneous, 16 x 10 x 12 mm mass in the  left orbit abutting the inferior rectus. Globes and orbits otherwise unremarkable. Mild left maxillary sinus mucosal thickening. Mild right ethmoid and left maxillary sinus mucosal thickening. Clear mastoid air cells. Right-sided nasal cannula. Other: None. IMPRESSION: 1. No acute intracranial abnormalities. 2. Atrophy advanced for age. 3. 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 It measures 16 mm in greatest dimension. This would be better characterized with orbital MRI with and without contrast. Electronically Signed   By: Lajean Manes M.D.   On: 08/13/2018 15:27   Dg Chest Port 1 View  Result Date: 08/15/2018 CLINICAL DATA:  Hypoxia EXAM: PORTABLE CHEST 1 VIEW COMPARISON:  August 13, 2018 FINDINGS: Endotracheal tube tip is 5.0 cm above the carina. Nasogastric tube tip is in the stomach with the side port essentially at the  gastroesophageal junction. No pneumothorax. No edema or consolidation. Heart size and pulmonary vascularity are normal. No adenopathy. There is aortic atherosclerosis. There old healed rib fractures on the right. IMPRESSION: Tube and catheter positions as described without pneumothorax. Nasogastric tube side port is at the gastroesophageal junction. Advise advancing nasogastric tube 4-5 cm to ensure both tube tip and side port are within the stomach. No edema or consolidation. Heart size normal. There is aortic atherosclerosis. Aortic Atherosclerosis (ICD10-I70.0). Electronically Signed   By: Lowella Grip III M.D.   On: 08/15/2018 07:29   Dg Chest Portable 1 View  Result Date: 08/13/2018 CLINICAL DATA:  Status post intubation EXAM: PORTABLE CHEST 1 VIEW COMPARISON:  08/13/2018 FINDINGS: Endotracheal tube with the tip 3.5 cm above the carina. Nasogastric tube coursing below the diaphragm. Elevation of the left diaphragm. Mild left basilar airspace disease likely reflecting atelectasis versus pneumonia. No focal consolidation. No pleural effusion or pneumothorax. Stable cardiomediastinal silhouette. No acute osseous abnormality. IMPRESSION: 1. Endotracheal tube with the tip 3.5 cm of the the carina. Nasogastric tube coursing below the diaphragm. 2. Mild left basilar airspace disease which may reflect atelectasis versus pneumonia. Electronically Signed   By: Kathreen Devoid   On: 08/13/2018 17:11   Dg Chest Portable 1 View  Result Date: 08/13/2018 CLINICAL DATA:  Did dyspnea and altered mentation EXAM: PORTABLE CHEST 1 VIEW COMPARISON:  05/19/2013 FINDINGS: Emphysematous hyperinflation of the lungs. Normal heart size. Aortic atherosclerosis at the arch without aneurysm. Remote right-sided sixth and seventh rib fractures with healing. No acute osseous abnormality. No pulmonary consolidation or edema. Blunting of the left lateral costophrenic angle is noted, nonspecific but may reflect an area of atelectasis,  scarring and/or trace effusion. IMPRESSION: 1. Pulmonary hyperinflation. 2. Blunting of the left lateral costophrenic angle as above possibly related to atelectasis, scarring and/or trace effusion. 3. Aortic atherosclerosis. Electronically Signed   By: Ashley Royalty M.D.   On: 08/13/2018 13:38   Mr Rosealee Albee UY Contrast  Result Date: 08/14/2018 CLINICAL DATA:  Mass lesion left orbit EXAM: MRI OF THE ORBITS WITHOUT AND WITH CONTRAST TECHNIQUE: Multiplanar, multisequence MR imaging of the orbits was performed both before and after the administration of intravenous contrast. CONTRAST:  6 mL Gadovist IV COMPARISON:  CT head 08/13/2018, 12/31/2015 FINDINGS: Two mass lesions in the left inferior orbit. These are well circumscribed and homogeneous low signal on T1 and high signal on T2. Larger proximal lesion is intraconal and lateral and superior to the inferior rectus. The smaller anterior lesion is located immediately lateral to the inferior rectus. The lesion show mild-to-moderate heterogeneous enhancement. The larger proximal lesion measures 10 x 14 mm and the smaller  distal lesion measures 9 mm in diameter. These are not associated with the optic nerve. Extraocular muscles normal. Optic nerve normal. No bony changes identified. The globe is normal bilaterally. Orbital fat otherwise normal. Lacrimal gland normal. Optic chiasm normal.  Cavernous sinus normal bilaterally. Limited intracranial imaging reveals moderate atrophy. 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 Mucosal edema paranasal sinuses without air-fluid level. IMPRESSION: Two masses lesions in the left inferior orbit are nearly contiguous. These are well circumscribed with heterogeneous enhancement. Review of prior studies reveals that at least the larger lesion is unchanged from 2017. The lesions are separate from the muscles and optic nerve. 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 Aggressive tumor such as metastatic  disease not considered likely given the stability over time. Hemangioma considered less likely. No other lesions. Electronically Signed   By: Franchot Gallo M.D.   On: 08/14/2018 18:16     CBC Recent Labs  Lab 08/13/18 1328 08/13/18 1336 08/14/18 0321 08/15/18 0610 08/18/18 0259  WBC 6.4  --  15.3* 15.9* 7.7  HGB 13.2 13.9 13.2 12.0* 10.9*  HCT 40.3 41.0 41.3 36.4* 33.5*  PLT 207  --  197 152 169  MCV 104.4*  --  104.3* 103.1* 104.4*  MCH 34.2*  --  33.3 34.0 34.0  MCHC 32.8  --  32.0 33.0 32.5  RDW 13.8  --  13.8 14.6 13.5  LYMPHSABS 1.9  --   --   --   --   MONOABS 0.8  --   --   --   --   EOSABS 0.1  --   --   --   --   BASOSABS 0.1  --   --   --   --     Chemistries  Recent Labs  Lab 08/13/18 1328  08/15/18 0610 08/16/18 0225 08/17/18 0624 08/18/18 0259 08/19/18 0310  NA 138   < > 142 144 145 143 142  K 4.5   < > 4.0 3.5 3.6 3.4* 3.4*  CL 104   < > 105 107 107 107 106  CO2 21*   < > 26 27 25 23 25   GLUCOSE 106*   < > 113* 88 87 80 92  BUN 38*   < > 42* 24* 18 16 15   CREATININE 1.49*   < > 1.51* 1.13 1.17 1.21 1.11  CALCIUM 9.3   < > 8.8* 9.1 8.9 8.8* 8.9  MG  --    < > 1.9 1.2* 1.6* 1.2* 1.1*  AST 35  --  24  --   --  26  --   ALT 32  --  18  --   --  17  --   ALKPHOS 41  --  33*  --   --  48  --   BILITOT 1.0  --  0.5  --   --  1.4*  --    < > = values in this interval not displayed.   ------------------------------------------------------------------------------------------------------------------ estimated creatinine clearance is 68.5 mL/min (by C-G formula based on SCr of 1.11 mg/dL). ------------------------------------------------------------------------------------------------------------------ No results for input(s): HGBA1C in the last 72 hours. ------------------------------------------------------------------------------------------------------------------ No results for input(s): CHOL, HDL, LDLCALC, TRIG, CHOLHDL, LDLDIRECT in the last 72  hours. ------------------------------------------------------------------------------------------------------------------ No results for input(s): TSH, T4TOTAL, T3FREE, THYROIDAB in the last 72 hours.  Invalid input(s): FREET3 ------------------------------------------------------------------------------------------------------------------ No results for input(s): VITAMINB12, FOLATE, FERRITIN, TIBC, IRON, RETICCTPCT in the last 72 hours.  Coagulation profile No results for input(s):  INR, PROTIME in the last 168 hours.  No results for input(s): DDIMER in the last 72 hours.  Cardiac Enzymes No results for input(s): CKMB, TROPONINI, MYOGLOBIN in the last 168 hours.  Invalid input(s): CK ------------------------------------------------------------------------------------------------------------------ Invalid input(s): POCBNP   CBG: Recent Labs  Lab 08/18/18 1601 08/18/18 2004 08/18/18 2357 08/19/18 0408 08/19/18 0801  GLUCAP 79 90 115* 94 87       Studies: No results found.    No results found for: HGBA1C Lab Results  Component Value Date   CREATININE 1.11 08/19/2018       Scheduled Meds: . diazepam  2.5 mg Intravenous BID   Or  . diazepam  5 mg Oral BID  . folic acid  1 mg Intravenous Daily  . heparin  5,000 Units Subcutaneous Q8H  . [START ON 08/20/2018] Influenza vac split quadrivalent PF  0.5 mL Intramuscular Tomorrow-1000  . insulin aspart  0-15 Units Subcutaneous Q4H  . LORazepam  2 mg Intravenous Once  . mouth rinse  15 mL Mouth Rinse BID  . potassium chloride  40 mEq Oral BID  . thiamine injection  100 mg Intravenous Daily   Continuous Infusions: . lactated ringers 50 mL/hr at 08/18/18 1802     LOS: 6 days    Time spent: >30 MINS    Reyne Dumas  [facility] Pager [phone]. If 7PM-7AM, please contact night-coverage at [email or web address], password [facility] 08/19/2018, 8:36 AM  LOS: 6 days

## 2018-08-19 NOTE — Progress Notes (Signed)
Nutrition Follow Up  DOCUMENTATION CODES:   Non-severe (moderate) malnutrition in context of social or environmental circumstances  INTERVENTION:    Ensure Enlive po TID, each supplement provides 350 kcal and 20 grams of protein  NUTRITION DIAGNOSIS:   Moderate Malnutrition related to social / environmental circumstances (EtOH abuse) as evidenced by mild fat depletion, mild muscle depletion, moderate muscle depletion, ongoing  GOAL:   Patient will meet greater than or equal to 90% of their needs  MONITOR:   Vent status, Labs, Weight trends, TF tolerance, Skin, I & O's  ASSESSMENT:   57 year old male who presented to the ED on 11/13 from home with AMS. PMH significant for EtOH abuse, gout, COPD, and hypertension. Pt intubated in the ED. UDS positive for benzodiazepines. Of note, pt lost his wife on 11/6. Family concerned for possible suicide attempt.  11/15 extubated, TF via OGT discontinued  Transferred out of [facility] 11/16. [facility] 11/17.  Pt s/p Rapid Response Event for DTs requiring frequent sedatives 11/17. SLP evaluated pt at bedside 11/17 & 11/18. Rec NPO status. S/p MBSS today. Advanced to Dys 2-thin liquid diet.  Medications include folic acid & thiamine IV daily. Labs reviewed. K 3.4 (L). Mg 1.1 (L). CBG's [id number].  Diet Order:   Diet Order            DIET DYS 2 Room service appropriate? Yes; Fluid consistency: Thin  Diet effective now             EDUCATION NEEDS:   Not appropriate for education at this time  Skin:  Skin Assessment: Reviewed RN Assessment  Last BM:  11/18  Height:   Ht Readings from Last 1 Encounters:  08/17/18 6' (1.829 m)   Weight:   Wt Readings from Last 1 Encounters:  08/19/18 66 kg   Ideal Body Weight:  78.18 kg   BMI:  Body mass index is 19.73 kg/m.  Estimated Nutritional Needs:   Kcal:  1710  Protein:  85-100 grams  Fluid:  >/= 1.7 L  Arthur Holms, RD, LDN Pager  #: [phone] After-Hours Pager #: [phone]

## 2018-08-20 DIAGNOSIS — F10239 Alcohol dependence with withdrawal, unspecified: Secondary | ICD-10-CM

## 2018-08-20 DIAGNOSIS — F10129 Alcohol abuse with intoxication, unspecified: Secondary | ICD-10-CM

## 2018-08-20 LAB — GLUCOSE, CAPILLARY
GLUCOSE-CAPILLARY: 94 mg/dL (ref 70–99)
Glucose-Capillary: 108 mg/dL — ABNORMAL HIGH (ref 70–99)
Glucose-Capillary: 124 mg/dL — ABNORMAL HIGH (ref 70–99)
Glucose-Capillary: 91 mg/dL (ref 70–99)
Glucose-Capillary: 93 mg/dL (ref 70–99)

## 2018-08-20 LAB — CBC
HEMATOCRIT: 36.5 % — AB (ref 39.0–52.0)
Hemoglobin: 11.6 g/dL — ABNORMAL LOW (ref 13.0–17.0)
MCH: 32.9 pg (ref 26.0–34.0)
MCHC: 31.8 g/dL (ref 30.0–36.0)
MCV: 103.4 fL — AB (ref 80.0–100.0)
NRBC: 0 % (ref 0.0–0.2)
Platelets: 229 10*3/uL (ref 150–400)
RBC: 3.53 MIL/uL — AB (ref 4.22–5.81)
RDW: 13.1 % (ref 11.5–15.5)
WBC: 7.8 10*3/uL (ref 4.0–10.5)

## 2018-08-20 LAB — COMPREHENSIVE METABOLIC PANEL
ALT: 19 U/L (ref 0–44)
ANION GAP: 9 (ref 5–15)
AST: 27 U/L (ref 15–41)
Albumin: 3 g/dL — ABNORMAL LOW (ref 3.5–5.0)
Alkaline Phosphatase: 49 U/L (ref 38–126)
BUN: 15 mg/dL (ref 6–20)
CHLORIDE: 103 mmol/L (ref 98–111)
CO2: 26 mmol/L (ref 22–32)
Calcium: 8.9 mg/dL (ref 8.9–10.3)
Creatinine, Ser: 1.06 mg/dL (ref 0.61–1.24)
GFR calc non Af Amer: >60 mL/min (ref >60)
Glucose, Bld: 92 mg/dL (ref 70–99)
Potassium: 3.8 mmol/L (ref 3.5–5.1)
SODIUM: 138 mmol/L (ref 135–145)
Total Bilirubin: 0.8 mg/dL (ref 0.3–1.2)
Total Protein: 6.4 g/dL — ABNORMAL LOW (ref 6.5–8.1)

## 2018-08-20 LAB — MAGNESIUM: MAGNESIUM: 2 mg/dL (ref 1.7–2.4)

## 2018-08-20 MED ORDER — VITAMIN B-1 100 MG PO TABS
100.0000 mg | ORAL_TABLET | Freq: Every day | ORAL | Status: DC
  Administered 2018-08-20 – 2018-08-26 (×7): 100 mg via ORAL
  Filled 2018-08-20 (×7): qty 1

## 2018-08-20 MED ORDER — FOLIC ACID 1 MG PO TABS
1.0000 mg | ORAL_TABLET | Freq: Every day | ORAL | Status: DC
  Administered 2018-08-20 – 2018-08-26 (×7): 1 mg via ORAL
  Filled 2018-08-20 (×7): qty 1

## 2018-08-20 MED ORDER — NICOTINE 21 MG/24HR TD PT24
21.0000 mg | MEDICATED_PATCH | Freq: Every day | TRANSDERMAL | Status: DC
  Administered 2018-08-20 – 2018-08-24 (×5): 21 mg via TRANSDERMAL
  Filled 2018-08-20 (×7): qty 1

## 2018-08-20 NOTE — Progress Notes (Signed)
Patient ID: Jeremiah Tucker, male   DOB: 04-03-1961, 57 y.o.   MRN: [id number]  PROGRESS NOTE    Jeremiah Tucker  ZTI:[id number] DOB: 12-18-1960 DOA: 08/13/2018 PCP: [name], MD   Brief Narrative:  57 year old male with history of alcohol abuse, hypertension presented on 08/13/2018 with altered mental status and was intubated in the ER.  He was admitted to ICU under critical care service.  Urine drug screen was positive for benzodiazepines.  Patient's wife had passed away 1 week prior to admission.  Patient was also started on Unasyn for probable aspiration pneumonia.  EEG was negative for epileptiform activity.  He was extubated on 08/15/2018 and was transferred to the floor on 08/16/2018.  Patient has had episodes of intermittent agitation.  He was transferred to [facility] on 08/17/2018.   Assessment & Plan:   Active Problems:   Acute encephalopathy   Malnutrition of moderate degree  Acute toxic encephalopathy -Probably secondary to benzodiazepine overdose along with alcohol intoxication, now with alcohol withdrawal/DTs -Patient had to be restrained briefly early this morning.  Restraints have been removed.  Monitor mental status -Fall precautions -Use Ativan/Haldol as needed severe agitation.  Continue scheduled Valium -Diet as per SLP recommendations -Continue PT evaluation.  Might need SNF.  Will consult social worker  Alcohol abuse with alcohol intoxication and withdrawal -Continue CIWA protocol.  Continue thiamine, folic acid.  Acute hypoxic respiratory failure with probable aspiration -Resolved.  Patient has been extubated and is currently on room air. -Patient was briefly treated with antibiotics which have been discontinued  Leukocytosis -Resolved  Moderate malnutrition -Follow nutrition recommendations  Essential hypertension -Home meds on hold.  Monitor blood pressure  Macrocytic anemia -Hemoglobin stable next  Orbital tumor -Outpatient follow-up with  ophthalmologist  DVT prophylaxis: Heparin Code Status: Full Family Communication: None at bedside Disposition Plan: Depends on clinical outcome  Consultants: PCCM  Procedures: None  Antimicrobials: 1 dose of Zosyn on 08/13/2018.  Unasyn on 08/14/2018  Subjective: Patient seen and examined at bedside.  He is awake and not currently agitated.  He is confused.  Off restraints.  Nursing staff reports agitation overnight.  No overnight fever or vomiting.  Objective: Vitals:   08/19/18 0412 08/19/18 1353 08/19/18 2009 08/20/18 0340  BP: 125/64 (!) 119/58 138/70 (!) 149/73  Pulse: 80 84 70 74  Resp: (!) 26 (!) 21 20 (!) 26  Temp: 98.6 F (37 C) 98.7 F (37.1 C) 97.6 F (36.4 C) 98.7 F (37.1 C)  TempSrc: Oral Oral Oral Oral  SpO2: 99% 99% 100% 100%  Weight: 66 kg     Height:        Intake/Output Summary (Last 24 hours) at 08/20/2018 1050 Last data filed at 08/20/2018 0900 Gross per 24 hour  Intake 400 ml  Output 1850 ml  Net -1450 ml   Filed Weights   08/16/18 0122 08/17/18 0439 08/19/18 0412  Weight: 67.5 kg 63.4 kg 66 kg    Examination:  General exam: Appears calm and comfortable.  Appears older than stated age.  Awake but slightly confused to time Respiratory system: Bilateral decreased breath sounds at bases.  Intermittent tachypnea Cardiovascular system: S1 & S2 heard, Rate controlled Gastrointestinal system: Abdomen is nondistended, soft and nontender. Normal bowel sounds heard. Extremities: No cyanosis, clubbing, edema    Data Reviewed: I have personally reviewed following labs and imaging studies  CBC: Recent Labs  Lab 08/13/18 1328 08/13/18 1336 08/14/18 0321 08/15/18 0610 08/18/18 0259 08/20/18 0227  WBC 6.4  --  15.3* 15.9* 7.7 7.8  NEUTROABS 3.6  --   --   --   --   --   HGB 13.2 13.9 13.2 12.0* 10.9* 11.6*  HCT 40.3 41.0 41.3 36.4* 33.5* 36.5*  MCV 104.4*  --  104.3* 103.1* 104.4* 103.4*  PLT 207  --  197 152 169 154   Basic Metabolic  Panel: Recent Labs  Lab 08/14/18 0321 08/14/18 0814 08/14/18 1613 08/15/18 0610 08/16/18 0225 08/17/18 0624 08/18/18 0259 08/19/18 0310 08/20/18 0227  NA 138  --   --  142 144 145 143 142 138  K 4.6  --   --  4.0 3.5 3.6 3.4* 3.4* 3.8  CL 107  --   --  105 107 107 107 106 103  CO2 15*  --   --  26 27 25 23 25 26   GLUCOSE 146*  --   --  113* 88 87 80 92 92  BUN 35*  --   --  42* 24* 18 16 15 15   CREATININE 1.38*  --   --  1.51* 1.13 1.17 1.21 1.11 1.06  CALCIUM 8.9  --   --  8.8* 9.1 8.9 8.8* 8.9 8.9  MG 1.0* 1.6* 2.0 1.9 1.2* 1.6* 1.2* 1.1* 2.0  PHOS 4.1 4.4 3.9 1.9* 2.9  --   --   --   --    GFR: Estimated Creatinine Clearance: 71.8 mL/min (by C-G formula based on SCr of 1.06 mg/dL). Liver Function Tests: Recent Labs  Lab 08/13/18 1328 08/15/18 0610 08/18/18 0259 08/20/18 0227  AST 35 24 26 27   ALT 32 18 17 19   ALKPHOS 41 33* 48 49  BILITOT 1.0 0.5 1.4* 0.8  PROT 7.0 6.0* 6.4* 6.4*  ALBUMIN 4.2 3.2* 2.8* 3.0*   No results for input(s): LIPASE, AMYLASE in the last 168 hours. No results for input(s): AMMONIA in the last 168 hours. Coagulation Profile: No results for input(s): INR, PROTIME in the last 168 hours. Cardiac Enzymes: Recent Labs  Lab 08/13/18 1749  CKTOTAL 915*   BNP (last 3 results) No results for input(s): PROBNP in the last 8760 hours. HbA1C: No results for input(s): HGBA1C in the last 72 hours. CBG: Recent Labs  Lab 08/19/18 0801 08/19/18 1125 08/19/18 1607 08/19/18 2006 08/20/18 0807  GLUCAP 87 99 113* 101* 91   Lipid Profile: No results for input(s): CHOL, HDL, LDLCALC, TRIG, CHOLHDL, LDLDIRECT in the last 72 hours. Thyroid Function Tests: No results for input(s): TSH, T4TOTAL, FREET4, T3FREE, THYROIDAB in the last 72 hours. Anemia Panel: No results for input(s): VITAMINB12, FOLATE, FERRITIN, TIBC, IRON, RETICCTPCT in the last 72 hours. Sepsis Labs: Recent Labs  Lab 08/13/18 1749  PROCALCITON <0.10    Recent Results (from the  past 240 hour(s))  Blood culture (routine x 2)     Status: None   Collection Time: 08/13/18  5:45 PM  Result Value Ref Range Status   Specimen Description BLOOD RIGHT ANTECUBITAL  Final   Special Requests   Final    BOTTLES DRAWN AEROBIC AND ANAEROBIC Blood Culture results may not be optimal due to an excessive volume of blood received in culture bottles   Culture   Final    NO GROWTH 5 DAYS Performed at [facility], [place] [address], [place],  [address]    Report Status 08/18/2018 FINAL  Final  Blood culture (routine x 2)     Status: None   Collection Time: 08/13/18  6:00 PM  Result  Value Ref Range Status   Specimen Description BLOOD RIGHT HAND  Final   Special Requests   Final    BOTTLES DRAWN AEROBIC AND ANAEROBIC Blood Culture results may not be optimal due to an excessive volume of blood received in culture bottles   Culture   Final    NO GROWTH 5 DAYS Performed at [place] [address], [place], [place] [address]    Report Status 08/18/2018 FINAL  Final  MRSA PCR Screening     Status: None   Collection Time: 08/13/18  6:52 PM  Result Value Ref Range Status   MRSA by PCR NEGATIVE NEGATIVE Final    Comment:        The GeneXpert MRSA Assay (FDA approved for NASAL specimens only), is one component of a comprehensive MRSA colonization surveillance program. It is not intended to diagnose MRSA infection nor to guide or monitor treatment for MRSA infections. Performed at [facility], [place] [address], [place], [place] [address]          Radiology Studies: No results found.      Scheduled Meds: . diazepam  2.5 mg Intravenous BID   Or  . diazepam  5 mg Oral BID  . feeding supplement (ENSURE ENLIVE)  237 mL Oral TID BM  . folic acid  1 mg Oral Daily  . heparin  5,000 Units Subcutaneous Q8H  . Influenza vac split quadrivalent PF  0.5 mL Intramuscular Tomorrow-1000  . insulin aspart  0-15 Units Subcutaneous Q4H  . LORazepam  2 mg  Intravenous Once  . mouth rinse  15 mL Mouth Rinse BID  . nicotine  21 mg Transdermal Daily  . potassium chloride  40 mEq Oral BID  . thiamine  100 mg Oral Daily   Continuous Infusions: . lactated ringers 50 mL/hr at 08/19/18 1323     LOS: 7 days        Aline August, MD [facility] Pager [phone]  If 7PM-7AM, please contact night-coverage [email or web address] Password [email or web address] 08/20/2018, 10:50 AM

## 2018-08-20 NOTE — Progress Notes (Addendum)
Physical Therapy Treatment Patient Details Name: Jeremiah Tucker MRN: [id number] DOB: 04-03-1961 Today's Date: 08/20/2018    History of Present Illness 57 year old male with h/o of hypertension and alcohol abuse was brought to the emergency room after being found obtunded, minimally responsive, was suspected to be secondary to drug overdose, benzos noted in urine drug screen, alcohol intoxication, intubated on 11/13, extubated 11/15. Now in DTs.    PT Comments    Patient with increased confusion and restlessness this session, although no physical aggression towards PT. Performing transfers with two person moderate assistance and ambulating 80 feet with walker and moderate assistance (+2 for safety/equipment). HR up to 120's during mobility. Continues to demonstrate decreased safety awareness, decreased gait speed, gait abnormalities, and poor static and dynamic balance which indicate he is at high risk for falls. Upon return to bed, patient ripping off telemetry leads and attempting to get out of bed with all 4 bed rails up. Also, pulling on IV pole which caused it to almost fall over until PT caught it. Able to calm down somewhat with nephew in room to talk to him. Leads reapplied. Continue to highly recommend SNF for ongoing Physical Therapy.       Follow Up Recommendations  Supervision/Assistance - 24 hour;SNF     Equipment Recommendations  Rolling walker with 5" wheels    Recommendations for Other Services       Precautions / Restrictions Precautions Precautions: Fall Precaution Comments: Restraints Restrictions Weight Bearing Restrictions: No    Mobility  Bed Mobility Overal bed mobility: Needs Assistance Bed Mobility: Supine to Sit;Sit to Supine     Supine to sit: Min guard Sit to supine: Min guard   General bed mobility comments: min guard for safety   Transfers Overall transfer level: Needs assistance Equipment used: 1 person hand held assist Transfers: Sit  to/from Stand Sit to Stand: Mod assist;+2 physical assistance         General transfer comment: Mod assist + 2 to power up from bed  Ambulation/Gait Ambulation/Gait assistance: Mod assist;+2 safety/equipment Gait Distance (Feet): 80 Feet Assistive device: Rolling walker (2 wheeled) Gait Pattern/deviations: Ataxic;Step-through pattern;Shuffle;Decreased stride length Gait velocity: decreased Gait velocity interpretation: <1.8 ft/sec, indicate of risk for recurrent falls General Gait Details: patient with shuffling gait pattern with noticeable decreased bilateral foot clearance. requires manual steering of walker for safety. cues for walker proximity but pt unable to maintain. drifts right and left with unsteadiness requiring modA for balance and + 2 for equipment   Stairs             Wheelchair Mobility    Modified Rankin (Stroke Patients Only)       Balance Overall balance assessment: Needs assistance Sitting-balance support: Feet supported Sitting balance-Leahy Scale: Fair     Standing balance support: During functional activity Standing balance-Leahy Scale: Poor Standing balance comment: poor ability to maintain upright                            Cognition Arousal/Alertness: Awake/alert Behavior During Therapy: WFL for tasks assessed/performed Overall Cognitive Status: Impaired/Different from baseline Area of Impairment: Orientation;Attention;Memory;Following commands;Awareness;Safety/judgement;Problem solving                 Orientation Level: Disoriented to;Situation;Place;Time Current Attention Level: Sustained Memory: Decreased recall of precautions;Decreased short-term memory Following Commands: Follows one step commands inconsistently;Follows one step commands with increased time Safety/Judgement: Decreased awareness of safety;Decreased awareness of deficits Awareness:  Intellectual Problem Solving: Slow processing;Decreased  initiation;Difficulty sequencing;Requires verbal cues;Requires tactile cues General Comments: Pt oriented to self only. following simple commands > 50% of the time and answers some questions appropriately. Stating, "are we going to jump out of the window?" and "my nephew has the car ready for me now." At end of session, pt pulling out leads and attempting to pull out catheter.      Exercises      General Comments        Pertinent Vitals/Pain Pain Assessment: Faces Faces Pain Scale: No hurt    Home Living                      Prior Function            PT Goals (current goals can now be found in the care plan section) Acute Rehab PT Goals Patient Stated Goal: to go home tomorrow PT Goal Formulation: With patient Time For Goal Achievement: 09/02/18 Potential to Achieve Goals: Fair Progress towards PT goals: Progressing toward goals    Frequency    Min 3X/week      PT Plan Current plan remains appropriate    Co-evaluation              AM-PAC PT "6 Clicks" Daily Activity  Outcome Measure  Difficulty turning over in bed (including adjusting bedclothes, sheets and blankets)?: Unable Difficulty moving from lying on back to sitting on the side of the bed? : Unable Difficulty sitting down on and standing up from a chair with arms (e.g., wheelchair, bedside commode, etc,.)?: Unable Help needed moving to and from a bed to chair (including a wheelchair)?: A Little Help needed walking in hospital room?: A Little Help needed climbing 3-5 steps with a railing? : A Lot 6 Click Score: 11    End of Session Equipment Utilized During Treatment: Gait belt Activity Tolerance: Patient tolerated treatment well Patient left: in bed;with call bell/phone within reach;with bed alarm set;with restraints reapplied;Other (comment);with family/visitor present(telesitter) Nurse Communication: Mobility status PT Visit Diagnosis: Unsteadiness on feet (R26.81);Difficulty in  walking, not elsewhere classified (R26.2);Other symptoms and signs involving the nervous system (R29.898)     Time: 0158-6825 PT Time Calculation (min) (ACUTE ONLY): 24 min  Charges:  $Gait Training: 8-22 mins $Therapeutic Activity: 8-22 mins                     Ellamae Sia, PT, DPT Acute Rehabilitation Services Pager [phone] Office [phone]    Willy Eddy 08/20/2018, 4:03 PM

## 2018-08-20 NOTE — Progress Notes (Signed)
  Speech Language Pathology Treatment: Dysphagia  Patient Details Name: Jeremiah Tucker MRN: [id number] DOB: [birth year] Today's Date: 08/20/2018 Time: 3810-1751 SLP Time Calculation (min) (ACUTE ONLY): 11 min  Assessment / Plan / Recommendation Clinical Impression  Upon SLP arrival pt was being calmed by staff. Throughout SLP visit he was calm, mildly confused but able to recognize me and recall the MBS we did on previous date. He was appropriately tearful when talking about his wife. Although pt remembered participating in [facility], he needed Total A for recall of results/recommendations. He consumed soft solids and thin liquids with Mod cues needed for regulation of bolus size and use of second swallow. One delayed cough was noted at the end of session but no other overt s/s of aspiration were observed. Recommend continuing current diet and precautions including use of full supervision to facilitate use of strategies.   HPI HPI: 57 year old male with h/o of hypertension and alcohol abuse was brought to the emergency room after being found obtunded, minimally responsive, was suspected to be secondary to drug overdose, benzos noted in urine drug screen, alcohol intoxication, intubated on 11/13, extubated 11/15. Now in DTs.       SLP Plan  Continue with current plan of care       Recommendations  Diet recommendations: Dysphagia 2 (fine chop);Thin liquid Liquids provided via: Cup;Straw Medication Administration: Whole meds with puree Supervision: Staff to assist with self feeding;Full supervision/cueing for compensatory strategies Compensations: Slow rate;Small sips/bites;Minimize environmental distractions;Multiple dry swallows after each bite/sip Postural Changes and/or Swallow Maneuvers: Seated upright 90 degrees                Oral Care Recommendations: Oral care BID Follow up Recommendations: Skilled Nursing facility SLP Visit Diagnosis: Dysphagia, oropharyngeal phase  (R13.12) Plan: Continue with current plan of care       GO                Germain Osgood 08/20/2018, 11:38 AM  Germain Osgood, M.A. [place] Acute Environmental education officer [phone] Office [phone]

## 2018-08-20 NOTE — Progress Notes (Signed)
Patient with increasing confusion and agitation, getting out of bed and striking at staff now restrained with bilateral soft wrist restraints. Patient has not responded to IV medication for agitation, attempts at reorienting and less restrictive interventions. Will continue to monitor.

## 2018-08-21 DIAGNOSIS — F332 Major depressive disorder, recurrent severe without psychotic features: Secondary | ICD-10-CM

## 2018-08-21 LAB — BASIC METABOLIC PANEL
Anion gap: 6 (ref 5–15)
BUN: 16 mg/dL (ref 6–20)
CHLORIDE: 109 mmol/L (ref 98–111)
CO2: 26 mmol/L (ref 22–32)
Calcium: 9.4 mg/dL (ref 8.9–10.3)
Creatinine, Ser: 1.11 mg/dL (ref 0.61–1.24)
Glucose, Bld: 73 mg/dL (ref 70–99)
POTASSIUM: 4.6 mmol/L (ref 3.5–5.1)
Sodium: 141 mmol/L (ref 135–145)

## 2018-08-21 LAB — GLUCOSE, CAPILLARY
GLUCOSE-CAPILLARY: 86 mg/dL (ref 70–99)
GLUCOSE-CAPILLARY: 100 mg/dL — AB (ref 70–99)
GLUCOSE-CAPILLARY: 143 mg/dL — AB (ref 70–99)
Glucose-Capillary: 99 mg/dL (ref 70–99)
Glucose-Capillary: 82 mg/dL (ref 70–99)

## 2018-08-21 LAB — MAGNESIUM: MAGNESIUM: 1.5 mg/dL — AB (ref 1.7–2.4)

## 2018-08-21 NOTE — Consult Note (Signed)
[facility] Face-to-Face Psychiatry Consult   Reason for Consult:  Overdose/severe depression  Referring Physician:  Dr. Starla Link  Patient Identification: KARSIN PESTA MRN:  [id number] Principal Diagnosis: MDD (major depressive disorder), recurrent severe, without psychosis ([place]) Diagnosis:  Active Problems:   Acute encephalopathy   Malnutrition of moderate degree   Total Time spent with patient: 1 hour  Subjective:   Jeremiah Tucker is a 57 y.o. male patient admitted with acute hypoxemic respiratory failure requiring intubation.  HPI:   Per chart review, patient was found with altered mental status by his nephew at home. His family is concerned for a possible suicide attempt by benzodiazepine overdose. BAL was negative and UDS was positive for benzodiazepines on admission. He lost his wife a week prior to hospitalization. He has been depressed. He has a history of chronic alcohol abuse. He was intubated on admission and extubated on 11/15. He has had periods of intermittent agitation in the setting of alcohol withdrawal/DTs. He is receiving Valium 2.5-5 mg BID in the hospital. He received Haldol 5 mg (x 2 doses over the past 24 hours) and Ativan 2 mg (x 2 doses over the past 24 hours). PT recommends SNF placement due to decreased safety awareness and high risk for falls.    On interview, Mr. Jeremiah Tucker reports that his wife passed away on 2023/08/19.  He reports that her death was unexpected but she had chronic medical problems and was required oxygen for 10 years.  He reports that he ingested about 7-8 pills of Xanax that was prescribed for his wife because he was "feeling sorry for himself."  He reports that he wanted to forget about his worries.  He told the social worker that he overdosed to end his life although he appeared to be minimizing his ingestion to this notewriter.  He was drinking alcohol when he overdosed.  He reports a 15 year history of heavy alcohol use.  He drinks about one fifth of alcohol  weekly.  He denies a prior history of withdrawals.  He denies any history of legal charges.  He does not believe that his use is problematic.  He reports a history of depression several years ago after his father passed away but his mood spontaneously improved.  He saw a therapist as a child.  He denies problems with sleep or appetite.  He denies current SI, HI or AVH.  Spoke to patient's nephew by phone with his verbal consent.  He reports that he has concerns for his uncle's safety and believes that his ingestion was a suicide attempt.  He is in agreement with recommendation for inpatient psychiatric hospitalization.   Past Psychiatric History: Depression   Risk to Self:  Yes given suicide attempt.  Risk to Others:  None. Denies HI.  Prior Inpatient Therapy:  Denies  Prior Outpatient Therapy:  He has seen a therapist in the past.   Past Medical History:  Past Medical History:  Diagnosis Date  . Gout   . Hypertension    History reviewed. No pertinent surgical history. Family History: History reviewed. No pertinent family history. Family Psychiatric  History: Denies  Social History:  Social History   Substance and Sexual Activity  Alcohol Use Yes     Social History   Substance and Sexual Activity  Drug Use No    Social History   Socioeconomic History  . Marital status: Married    Spouse name: Not on file  . Number of children: Not on file  .  Years of education: Not on file  . Highest education level: Not on file  Occupational History  . Not on file  Social Needs  . Financial resource strain: Patient refused  . Food insecurity:    Worry: Patient refused    Inability: Patient refused  . Transportation needs:    Medical: Patient refused    Non-medical: Patient refused  Tobacco Use  . Smoking status: Current Every Day Smoker    Types: Cigarettes  . Smokeless tobacco: Never Used  Substance and Sexual Activity  . Alcohol use: Yes  . Drug use: No  . Sexual activity: Not  Currently  Lifestyle  . Physical activity:    Days per week: Patient refused    Minutes per session: Patient refused  . Stress: Not on file  Relationships  . Social connections:    Talks on phone: Patient refused    Gets together: Patient refused    Attends religious service: Patient refused    Active member of club or organization: Patient refused    Attends meetings of clubs or organizations: Patient refused    Relationship status: Patient refused  Other Topics Concern  . Not on file  Social History Narrative  . Not on file   Additional Social History: He lives at home with his dog. He was married for 31 years. His wife passed away on 08-Aug-2023. 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 He denies illicit substance use. He reports a chronic history of alcohol abuse.     Allergies:  No Known Allergies  Labs:  Results for orders placed or performed during the hospital encounter of 08/13/18 (from the past 48 hour(s))  Glucose, capillary     Status: Abnormal   Collection Time: 08/19/18  4:07 PM  Result Value Ref Range   Glucose-Capillary 113 (H) 70 - 99 mg/dL   Comment 1 Notify RN    Comment 2 Document in Chart   Glucose, capillary     Status: Abnormal   Collection Time: 08/19/18  8:06 PM  Result Value Ref Range   Glucose-Capillary 101 (H) 70 - 99 mg/dL   Comment 1 Notify RN    Comment 2 Document in Chart   Magnesium     Status: None   Collection Time: 08/20/18  2:27 AM  Result Value Ref Range   Magnesium 2.0 1.7 - 2.4 mg/dL    Comment: Performed at [facility], [place] [address], [place], [place] [address]  Comprehensive metabolic panel     Status: Abnormal   Collection Time: 08/20/18  2:27 AM  Result Value Ref Range   Sodium 138 135 - 145 mmol/L   Potassium 3.8 3.5 - 5.1 mmol/L   Chloride 103 98 - 111 mmol/L   CO2 26 22 - 32 mmol/L   Glucose, Bld 92 70 - 99 mg/dL   BUN 15 6 - 20 mg/dL   Creatinine, Ser 1.06 0.61 - 1.24 mg/dL   Calcium 8.9 8.9 - 10.3 mg/dL   Total Protein  6.4 (L) 6.5 - 8.1 g/dL   Albumin 3.0 (L) 3.5 - 5.0 g/dL   AST 27 15 - 41 U/L   ALT 19 0 - 44 U/L   Alkaline Phosphatase 49 38 - 126 U/L   Total Bilirubin 0.8 0.3 - 1.2 mg/dL   GFR calc non Af Amer >60 >60 mL/min   GFR calc Af Amer >60 >60 mL/min    Comment: (NOTE) The eGFR has been calculated using the CKD EPI equation.  This calculation has not been validated in all clinical situations. eGFR's persistently <60 mL/min signify possible Chronic Kidney Disease.    Anion gap 9 5 - 15    Comment: Performed at [facility] [address], [place], [place] [address]  CBC     Status: Abnormal   Collection Time: 08/20/18  2:27 AM  Result Value Ref Range   WBC 7.8 4.0 - 10.5 K/uL   RBC 3.53 (L) 4.22 - 5.81 MIL/uL   Hemoglobin 11.6 (L) 13.0 - 17.0 g/dL   HCT 36.5 (L) 39.0 - 52.0 %   MCV 103.4 (H) 80.0 - 100.0 fL   MCH 32.9 26.0 - 34.0 pg   MCHC 31.8 30.0 - 36.0 g/dL   RDW 13.1 11.5 - 15.5 %   Platelets 229 150 - 400 K/uL   nRBC 0.0 0.0 - 0.2 %    Comment: Performed at [facility], [place] [address] [place], [place] [address]  Glucose, capillary     Status: None   Collection Time: 08/20/18  8:07 AM  Result Value Ref Range   Glucose-Capillary 91 70 - 99 mg/dL   Comment 1 Notify RN   Glucose, capillary     Status: None   Collection Time: 08/20/18 11:16 AM  Result Value Ref Range   Glucose-Capillary 93 70 - 99 mg/dL   Comment 1 Notify RN   Glucose, capillary     Status: None   Collection Time: 08/20/18  4:01 PM  Result Value Ref Range   Glucose-Capillary 94 70 - 99 mg/dL   Comment 1 Notify RN   Glucose, capillary     Status: Abnormal   Collection Time: 08/20/18  7:47 PM  Result Value Ref Range   Glucose-Capillary 108 (H) 70 - 99 mg/dL   Comment 1 Notify RN    Comment 2 Document in Chart   Glucose, capillary     Status: Abnormal   Collection Time: 08/20/18 11:53 PM  Result Value Ref Range   Glucose-Capillary 124 (H) 70 - 99 mg/dL   Comment 1 Notify RN    Comment 2  Document in Chart   Basic metabolic panel     Status: None   Collection Time: 08/21/18  3:22 AM  Result Value Ref Range   Sodium 141 135 - 145 mmol/L   Potassium 4.6 3.5 - 5.1 mmol/L   Chloride 109 98 - 111 mmol/L   CO2 26 22 - 32 mmol/L   Glucose, Bld 73 70 - 99 mg/dL   BUN 16 6 - 20 mg/dL   Creatinine, Ser 1.11 0.61 - 1.24 mg/dL   Calcium 9.4 8.9 - 10.3 mg/dL   GFR calc non Af Amer >60 >60 mL/min   GFR calc Af Amer >60 >60 mL/min    Comment: (NOTE) The eGFR has been calculated using the CKD EPI equation. This calculation has not been validated in all clinical situations. eGFR's persistently <60 mL/min signify possible Chronic Kidney Disease.    Anion gap 6 5 - 15    Comment: Performed at [facility] [address] [place], [place] [address]  Magnesium     Status: Abnormal   Collection Time: 08/21/18  3:22 AM  Result Value Ref Range   Magnesium 1.5 (L) 1.7 - 2.4 mg/dL    Comment: Performed at [facility] [address], [place], [place] [address]  Glucose, capillary     Status: None   Collection Time: 08/21/18  4:08  AM  Result Value Ref Range   Glucose-Capillary 86 70 - 99 mg/dL   Comment 1 Notify RN    Comment 2 Document in Chart   Glucose, capillary     Status: Abnormal   Collection Time: 08/21/18  8:05 AM  Result Value Ref Range   Glucose-Capillary 100 (H) 70 - 99 mg/dL   Comment 1 Notify RN    Comment 2 Document in Chart   Glucose, capillary     Status: None   Collection Time: 08/21/18 12:28 PM  Result Value Ref Range   Glucose-Capillary 99 70 - 99 mg/dL   Comment 1 Notify RN    Comment 2 Document in Chart     Current Facility-Administered Medications  Medication Dose Route Frequency Provider Last Rate Last Dose  . diazepam (VALIUM) injection 2.5 mg  2.5 mg Intravenous BID Domenic Polite, MD   2.5 mg at 08/20/18 2104   Or  . diazepam (VALIUM) tablet 5 mg  5 mg Oral BID Domenic Polite, MD   5 mg at 08/21/18 1017  . feeding supplement (ENSURE  ENLIVE) (ENSURE ENLIVE) liquid 237 mL  237 mL Oral TID BM Reyne Dumas, MD   237 mL at 08/21/18 1017  . folic acid (FOLVITE) tablet 1 mg  1 mg Oral Daily Alvira Philips, [place]   1 mg at 08/21/18 1017  . haloperidol lactate (HALDOL) injection 5 mg  5 mg Intravenous Q6H PRN Omar Person, NP   5 mg at 08/21/18 0746  . heparin injection 5,000 Units  5,000 Units Subcutaneous Q8H Chesley Mires, MD   5,000 Units at 08/21/18 0555  . hydrALAZINE (APRESOLINE) injection 10 mg  10 mg Intravenous Q6H PRN Abrol, Ascencion Dike, MD      . Influenza vac split quadrivalent PF (FLUARIX) injection 0.5 mL  0.5 mL Intramuscular Tomorrow-1000 Abrol, Nayana, MD      . insulin aspart (novoLOG) injection 0-15 Units  0-15 Units Subcutaneous Q4H Chesley Mires, MD   2 Units at 08/21/18 0047  . ipratropium-albuterol (DUONEB) 0.5-2.5 (3) MG/3ML nebulizer solution 3 mL  3 mL Nebulization Q4H PRN Chesley Mires, MD      . lactated ringers infusion   Intravenous Continuous Chesley Mires, MD 50 mL/hr at 08/21/18 0853    . LORazepam (ATIVAN) injection 2 mg  2 mg Intravenous Q2H PRN Domenic Polite, MD   2 mg at 08/20/18 1609  . LORazepam (ATIVAN) injection 2 mg  2 mg Intravenous Once Bodenheimer, Charles A, NP      . MEDLINE mouth rinse  15 mL Mouth Rinse BID Chesley Mires, MD   15 mL at 08/21/18 1018  . nicotine (NICODERM CQ - dosed in mg/24 hours) patch 21 mg  21 mg Transdermal Daily Starla Link, Kshit[place], MD   21 mg at 08/21/18 1017  . thiamine (VITAMIN B-1) tablet 100 mg  100 mg Oral Daily Alvira Philips, [place]   100 mg at 08/21/18 1017    Musculoskeletal: Strength & Muscle Tone: within normal limits Gait & Station: unable to stand since in 2 point soft restraints.  Patient leans: N/A  Psychiatric Specialty Exam: Physical Exam  Nursing note and vitals reviewed. Constitutional: He is oriented to person, place, and time. He appears well-developed and well-nourished.  HENT:  Head: Normocephalic and atraumatic.  Neck: Normal range  of motion.  Respiratory: Effort normal.  Musculoskeletal: Normal range of motion.  Neurological: He is alert and oriented to person, place, and time.  Psychiatric: His speech is normal  and behavior is normal. Judgment and thought content normal. Cognition and memory are normal. He exhibits a depressed mood.    Review of Systems  Constitutional: Negative for chills and fever.  Cardiovascular: Negative for chest pain.  Gastrointestinal: Negative for abdominal pain, constipation, diarrhea, nausea and vomiting.  Psychiatric/Behavioral: Positive for depression and substance abuse. Negative for hallucinations and suicidal ideas. The patient does not have insomnia.   All other systems reviewed and are negative.   Blood pressure (!) 153/77, pulse 69, temperature 98.1 F (36.7 C), temperature source Oral, resp. rate 20, height 6' (1.829 m), weight 66 kg, SpO2 95 %.Body mass index is 19.73 kg/m.  General Appearance: Fairly Groomed, middle aged, Caucasian male, wearing a hospital gown who is lying in bed. NAD.   Eye Contact:  Good  Speech:  Clear and Coherent and Normal Rate  Volume:  Decreased  Mood:  Depressed  Affect:  Congruent  Thought Process:  Goal Directed, Linear and Descriptions of Associations: Intact  Orientation:  Full (Time, Place, and Person)  Thought Content:  Logical  Suicidal Thoughts:  No  Homicidal Thoughts:  No  Memory:  Immediate;   Good Recent;   Good Remote;   Good  Judgement:  Fair  Insight:  Fair  Psychomotor Activity:  Normal  Concentration:  Concentration: Good and Attention Span: Good  Recall:  Good  Fund of Knowledge:  Good  Language:  Good  Akathisia:  No  Handed:  Right  AIMS (if indicated):   N/A  Assets:  Communication Skills Housing Social Support  ADL's:  Intact  Cognition:  WNL  Sleep:   Okay    Assessment:  ORRIE SCHUBERT is a 57 y.o. male who was found with altered mental status by his nephew at home. His family is concerned for a  possible suicide attempt by benzodiazepine overdose. Patient admits to ingesting Xanax as a suicide attempt although he minimizes his attempt today. He reports depression in the setting of losing his wife. He also reports a history of chronic alcohol abuse. He warrants inpatient psychiatric hospitalization for stabilization and treatment.   Treatment Plan Summary: -Patient warrants inpatient psychiatric hospitalization given high risk of harm to self. -Continue Engineer, materials.  -Defer starting antidepressant or medication for alcohol use at this time due to worsening mental status (fluctuating per primary team and currently in 2 point soft restraints). Will defer management to inpatient psychiatric team.  -EKG reviewed and QTc 460 on 11/17. Please closely monitor when starting or increasing QTc prolonging agents.  -Please pursue involuntary commitment if patient refuses voluntary psychiatric hospitalization or attempts to leave the hospital.  -Will sign off on patient at this time. Please consult psychiatry again as needed.     Disposition: Recommend psychiatric Inpatient admission when medically cleared.  Faythe Dingwall, DO 08/21/2018 1:46 PM

## 2018-08-21 NOTE — Clinical Social Work Note (Signed)
Clinical Social Work Assessment  Patient Details  Name: Jeremiah Tucker MRN: [id number] Date of Birth: 10/25/1960  Date of referral:  08/21/18               Reason for consult:  Facility Placement, Mental Health Concerns, Care Management Concerns, Suicide Risk/Attempt, Discharge Planning                Permission sought to share information with:    Permission granted to share information::  No  Name::        Agency::     Relationship::     Contact Information:     Housing/Transportation Living arrangements for the past 2 months:  Single Family Home Source of Information:  Patient Patient Interpreter Needed:  None Criminal Activity/Legal Involvement Pertinent to Current Situation/Hospitalization:  No - Comment as needed Significant Relationships:  Parents Lives with:  Self Do you feel safe going back to the place where you live?  Yes Need for family participation in patient care:  Yes (Comment)  Care giving concerns: Patient admitted after being found minimally responsive after suspected overdose. History of alcohol abuse and positive for benzodiazepines on admission.    Social Worker assessment / plan: CSW met with patient at bedside. Patient in soft restraints. Patient not oriented to year, but was oriented to month, and day. Patient not oriented to situation. At one point in conversation, patient stated he was in [facility], but at another time stated we were inside a factory. CSW introduced self and role and elicited information from patient, though due to patient's mental status, did not discuss SNF or substance abuse.   When CSW asked why patient was admitted to the hospital, patient stated, "I took those pills because I wanted to die. But I'm not like that anymore." Patient became tearful and spoke about his wife. Patient reported that his wife died a week before he came into the hospital and he had her cremated. Patient concerned about collecting her remains since he is  admitted here in the hospital. CSW did briefly discuss grief process with patient and grief counseling.   Patient then exhibited varying orientation and spoke vaguely of a woman he was trying to help move, and alluded to currently being inside a factory. Patient frustrated about being in restraints.  Due to patient's mental status, not appropriate to discuss SNF disposition planning at this time. Also awaiting psychiatry recommendations. Noted patient continues to be agitated at times and remains in restraints. Would not be appropriate for SNF until mental status clears and was safe out of restraints.  CSW to follow for continued progress and recommendations and will support with disposition planning and community resources.  Employment status:    Insurance information:  Other (Comment Required)([employer]) PT Recommendations:  [place] / Referral to community resources:     Patient/Family's Response to care: Not discussed.   Patient/Family's Understanding of and Emotional Response to Diagnosis, Current Treatment, and Prognosis: Patient not oriented.  Emotional Assessment Appearance:  Appears stated age Attitude/Demeanor/Rapport:  Lethargic, Grieving, Crying Affect (typically observed):  Tearful/Crying, Flat, Frustrated, Grieving Orientation:  Fluctuating Orientation (Suspected and/or reported Sundowners) Alcohol / Substance use:  Tobacco Use, Alcohol Use Psych involvement (Current and /or in the community):  Yes (Comment)  Discharge Needs  Concerns to be addressed:  Denies Needs/Concerns at this time, Decision making concerns, Discharge Planning Concerns, Mental Health Concerns, Lack of Support, Substance Abuse Concerns, Grief and Loss Concerns, Care Coordination,  Coping/Stress Concerns Readmission within the last 30 days:  No Current discharge risk:  Cognitively Impaired, Dependent with Mobility Barriers to Discharge:  Continued Medical Work up,  Requiring sitter/restraints, [place], LCSW 08/21/2018, 4:26 PM

## 2018-08-21 NOTE — Progress Notes (Signed)
Patient ID: Jeremiah Tucker, male   DOB: [birth year], 57 y.o.   MRN: [id number]  PROGRESS NOTE    Jeremiah Tucker  DQQ:[id number] DOB: 23-Jun-1961 DOA: 08/13/2018 PCP: [name], MD   Brief Narrative:  56 year old male with history of alcohol abuse, hypertension presented on 08/13/2018 with altered mental status and was intubated in the ER.  He was admitted to ICU under critical care service.  Urine drug screen was positive for benzodiazepines.  Patient's wife had passed away 1 week prior to admission.  Patient was also started on Unasyn for probable aspiration pneumonia.  EEG was negative for epileptiform activity.  He was extubated on 08/15/2018 and was transferred to the floor on 08/16/2018.  Patient has had episodes of intermittent agitation.  He was transferred to [facility] on 08/17/2018.   Assessment & Plan:   Active Problems:   Acute encephalopathy   Malnutrition of moderate degree  Acute toxic encephalopathy -Probably secondary to benzodiazepine overdose along with alcohol intoxication, now with alcohol withdrawal/DTs -Patient is again in restraints and is slightly confused. Remove restraints as able -Fall precautions -Use Ativan/Haldol as needed severe agitation.  Continue scheduled Valium -Diet as per SLP recommendations -Continue PT evaluation. Consult Social worker for SNF placement  Alcohol abuse with alcohol intoxication and withdrawal -Continue CIWA protocol.  Continue thiamine, folic acid.  Acute hypoxic respiratory failure with probable aspiration -Resolved.  Patient has been extubated and is currently on room air. -Patient was briefly treated with antibiotics which have been discontinued  Leukocytosis -Resolved  Moderate malnutrition -Follow nutrition recommendations  Essential hypertension -Home meds on hold.  Monitor blood pressure  Macrocytic anemia -Hemoglobin stable next  Orbital tumor -Outpatient follow-up with ophthalmologist  DVT prophylaxis:  Heparin Code Status: Full Family Communication: None at bedside Disposition Plan: SNF in 1-2 days once clinically improves  Consultants: PCCM  Procedures: None  Antimicrobials: 1 dose of Zosyn on 08/13/2018.  Unasyn on 08/14/2018  Subjective: Patient seen and examined at bedside. He is slightly awake but confused to time. Slightly tremulous. In restraints. No overnight fever. Objective: Vitals:   08/20/18 0340 08/20/18 1320 08/20/18 1942 08/21/18 0407  BP: (!) 149/73 (!) 161/69 (!) 146/89 (!) 153/77  Pulse: 74 79 96 69  Resp: (!) 26 17 (!) 32 (!) 21  Temp: 98.7 F (37.1 C) 97.8 F (36.6 C) 98.7 F (37.1 C) 98.1 F (36.7 C)  TempSrc: Oral Oral Oral Oral  SpO2: 100% 95% 93% 95%  Weight:      Height:        Intake/Output Summary (Last 24 hours) at 08/21/2018 0902 Last data filed at 08/21/2018 0410 Gross per 24 hour  Intake 160 ml  Output 1000 ml  Net -840 ml   Filed Weights   08/16/18 0122 08/17/18 0439 08/19/18 0412  Weight: 67.5 kg 63.4 kg 66 kg    Examination:  General exam: Appears calm and comfortable.  Appears older than stated age.  Awake but slightly confused to time. Slightly tremulous. In restraints. Respiratory system: Bilateral decreased breath sounds at bases.  Cardiovascular system: S1 & S2 heard, Not tachycardic Gastrointestinal system: Abdomen is nondistended, soft and nontender. Normal bowel sounds heard. Extremities: No cyanosis, edema    Data Reviewed: I have personally reviewed following labs and imaging studies  CBC: Recent Labs  Lab 08/15/18 0610 08/18/18 0259 08/20/18 0227  WBC 15.9* 7.7 7.8  HGB 12.0* 10.9* 11.6*  HCT 36.4* 33.5* 36.5*  MCV 103.1* 104.4* 103.4*  PLT 152 169 229  Basic Metabolic Panel: Recent Labs  Lab 08/14/18 1613  08/15/18 0610 08/16/18 0225 08/17/18 2202 08/18/18 0259 08/19/18 0310 08/20/18 0227 08/21/18 0322  NA  --    < > 142 144 145 143 142 138 141  K  --    < > 4.0 3.5 3.6 3.4* 3.4* 3.8 4.6  CL   --    < > 105 107 107 107 106 103 109  CO2  --    < > 26 27 25 23 25 26 26   GLUCOSE  --    < > 113* 88 87 80 92 92 73  BUN  --    < > 42* 24* 18 16 15 15 16   CREATININE  --    < > 1.51* 1.13 1.17 1.21 1.11 1.06 1.11  CALCIUM  --    < > 8.8* 9.1 8.9 8.8* 8.9 8.9 9.4  MG 2.0  --  1.9 1.2* 1.6* 1.2* 1.1* 2.0 1.5*  PHOS 3.9  --  1.9* 2.9  --   --   --   --   --    < > = values in this interval not displayed.   GFR: Estimated Creatinine Clearance: 68.5 mL/min (by C-G formula based on SCr of 1.11 mg/dL). Liver Function Tests: Recent Labs  Lab 08/15/18 0610 08/18/18 0259 08/20/18 0227  AST 24 26 27   ALT 18 17 19   ALKPHOS 33* 48 49  BILITOT 0.5 1.4* 0.8  PROT 6.0* 6.4* 6.4*  ALBUMIN 3.2* 2.8* 3.0*   No results for input(s): LIPASE, AMYLASE in the last 168 hours. No results for input(s): AMMONIA in the last 168 hours. Coagulation Profile: No results for input(s): INR, PROTIME in the last 168 hours. Cardiac Enzymes: No results for input(s): CKTOTAL, CKMB, CKMBINDEX, TROPONINI in the last 168 hours. BNP (last 3 results) No results for input(s): PROBNP in the last 8760 hours. HbA1C: No results for input(s): HGBA1C in the last 72 hours. CBG: Recent Labs  Lab 08/20/18 1601 08/20/18 1947 08/20/18 2353 08/21/18 0408 08/21/18 0805  GLUCAP 94 108* 124* 86 100*   Lipid Profile: No results for input(s): CHOL, HDL, LDLCALC, TRIG, CHOLHDL, LDLDIRECT in the last 72 hours. Thyroid Function Tests: No results for input(s): TSH, T4TOTAL, FREET4, T3FREE, THYROIDAB in the last 72 hours. Anemia Panel: No results for input(s): VITAMINB12, FOLATE, FERRITIN, TIBC, IRON, RETICCTPCT in the last 72 hours. Sepsis Labs: No results for input(s): PROCALCITON, LATICACIDVEN in the last 168 hours.  Recent Results (from the past 240 hour(s))  Blood culture (routine x 2)     Status: None   Collection Time: 08/13/18  5:45 PM  Result Value Ref Range Status   Specimen Description BLOOD RIGHT ANTECUBITAL   Final   Special Requests   Final    BOTTLES DRAWN AEROBIC AND ANAEROBIC Blood Culture results may not be optimal due to an excessive volume of blood received in culture bottles   Culture   Final    NO GROWTH 5 DAYS Performed at [facility], [place] [address], [place], [place] [address]    Report Status 08/18/2018 FINAL  Final  Blood culture (routine x 2)     Status: None   Collection Time: 08/13/18  6:00 PM  Result Value Ref Range Status   Specimen Description BLOOD RIGHT HAND  Final   Special Requests   Final    BOTTLES DRAWN AEROBIC AND ANAEROBIC Blood Culture results may not be optimal due to an excessive volume of blood  received in culture bottles   Culture   Final    NO GROWTH 5 DAYS Performed at [facility], [place] [address] [place], [place] [address]    Report Status 08/18/2018 FINAL  Final  MRSA PCR Screening     Status: None   Collection Time: 08/13/18  6:52 PM  Result Value Ref Range Status   MRSA by PCR NEGATIVE NEGATIVE Final    Comment:        The GeneXpert MRSA Assay (FDA approved for NASAL specimens only), is one component of a comprehensive MRSA colonization surveillance program. It is not intended to diagnose MRSA infection nor to guide or monitor treatment for MRSA infections. Performed at [facility], [place] [address], [place], [place] [address]          Radiology Studies: No results found.      Scheduled Meds: . diazepam  2.5 mg Intravenous BID   Or  . diazepam  5 mg Oral BID  . feeding supplement (ENSURE ENLIVE)  237 mL Oral TID BM  . folic acid  1 mg Oral Daily  . heparin  5,000 Units Subcutaneous Q8H  . Influenza vac split quadrivalent PF  0.5 mL Intramuscular Tomorrow-1000  . insulin aspart  0-15 Units Subcutaneous Q4H  . LORazepam  2 mg Intravenous Once  . mouth rinse  15 mL Mouth Rinse BID  . nicotine  21 mg Transdermal Daily  . potassium chloride  40 mEq Oral BID  . thiamine  100 mg Oral Daily   Continuous  Infusions: . lactated ringers 50 mL/hr at 08/21/18 0853     LOS: 8 days        Aline August, MD [facility] Pager [phone]  If 7PM-7AM, please contact night-coverage [email or web address] Password [email or web address] 08/21/2018, 9:02 AM

## 2018-08-22 LAB — CBC WITH DIFFERENTIAL/PLATELET
ABS IMMATURE GRANULOCYTES: 0.05 10*3/uL (ref 0.00–0.07)
BASOS ABS: 0.1 10*3/uL (ref 0.0–0.1)
BASOS PCT: 1 %
Eosinophils Absolute: 0.1 10*3/uL (ref 0.0–0.5)
Eosinophils Relative: 2 %
HCT: 34.6 % — ABNORMAL LOW (ref 39.0–52.0)
HEMOGLOBIN: 11.2 g/dL — AB (ref 13.0–17.0)
Immature Granulocytes: 1 %
LYMPHS PCT: 25 %
Lymphs Abs: 2 10*3/uL (ref 0.7–4.0)
MCH: 33.1 pg (ref 26.0–34.0)
MCHC: 32.4 g/dL (ref 30.0–36.0)
MCV: 102.4 fL — ABNORMAL HIGH (ref 80.0–100.0)
Monocytes Absolute: 1 10*3/uL (ref 0.1–1.0)
Monocytes Relative: 12 %
NEUTROS ABS: 4.9 10*3/uL (ref 1.7–7.7)
NEUTROS PCT: 59 %
NRBC: 0 % (ref 0.0–0.2)
PLATELETS: 241 10*3/uL (ref 150–400)
RBC: 3.38 MIL/uL — AB (ref 4.22–5.81)
RDW: 12.9 % (ref 11.5–15.5)
WBC: 8.2 10*3/uL (ref 4.0–10.5)

## 2018-08-22 LAB — GLUCOSE, CAPILLARY
GLUCOSE-CAPILLARY: 107 mg/dL — AB (ref 70–99)
GLUCOSE-CAPILLARY: 112 mg/dL — AB (ref 70–99)
GLUCOSE-CAPILLARY: 116 mg/dL — AB (ref 70–99)
GLUCOSE-CAPILLARY: 118 mg/dL — AB (ref 70–99)
GLUCOSE-CAPILLARY: 118 mg/dL — AB (ref 70–99)
Glucose-Capillary: 97 mg/dL (ref 70–99)
Glucose-Capillary: 88 mg/dL (ref 70–99)

## 2018-08-22 LAB — COMPREHENSIVE METABOLIC PANEL
ALBUMIN: 2.8 g/dL — AB (ref 3.5–5.0)
ALT: 24 U/L (ref 0–44)
ANION GAP: 8 (ref 5–15)
AST: 25 U/L (ref 15–41)
Alkaline Phosphatase: 52 U/L (ref 38–126)
BUN: 21 mg/dL — ABNORMAL HIGH (ref 6–20)
CHLORIDE: 102 mmol/L (ref 98–111)
CO2: 27 mmol/L (ref 22–32)
CREATININE: 1.18 mg/dL (ref 0.61–1.24)
Calcium: 9.8 mg/dL (ref 8.9–10.3)
GFR calc Af Amer: >60 mL/min (ref >60)
GFR calc non Af Amer: >60 mL/min (ref >60)
GLUCOSE: 115 mg/dL — AB (ref 70–99)
Potassium: 4.6 mmol/L (ref 3.5–5.1)
SODIUM: 137 mmol/L (ref 135–145)
Total Bilirubin: 0.4 mg/dL (ref 0.3–1.2)
Total Protein: 6.1 g/dL — ABNORMAL LOW (ref 6.5–8.1)

## 2018-08-22 LAB — MAGNESIUM: Magnesium: 1.4 mg/dL — ABNORMAL LOW (ref 1.7–2.4)

## 2018-08-22 MED ORDER — DIAZEPAM 5 MG/ML IJ SOLN
2.5000 mg | Freq: Two times a day (BID) | INTRAMUSCULAR | Status: DC
  Filled 2018-08-22: qty 2

## 2018-08-22 MED ORDER — DIAZEPAM 2 MG PO TABS
2.0000 mg | ORAL_TABLET | Freq: Two times a day (BID) | ORAL | Status: DC
  Administered 2018-08-22 – 2018-08-25 (×7): 2 mg via ORAL
  Filled 2018-08-22 (×7): qty 1

## 2018-08-22 MED ORDER — MAGNESIUM SULFATE 2 GM/50ML IV SOLN
2.0000 g | Freq: Once | INTRAVENOUS | Status: AC
  Administered 2018-08-22: 2 g via INTRAVENOUS
  Filled 2018-08-22: qty 50

## 2018-08-22 NOTE — Progress Notes (Signed)
Patient ID: Jeremiah Tucker, male   DOB: 31-Jan-1961, 57 y.o.   MRN: [id number]  PROGRESS NOTE    Jeremiah Tucker  TFT:[id number] DOB: [birth year] DOA: 08/13/2018 PCP: [name], MD   Brief Narrative:  57 year old male with history of alcohol abuse, hypertension presented on 08/13/2018 with altered mental status and was intubated in the ER.  He was admitted to ICU under critical care service.  Urine drug screen was positive for benzodiazepines.  Patient's wife had passed away 1 week prior to admission.  Patient was also started on Unasyn for probable aspiration pneumonia.  EEG was negative for epileptiform activity.  He was extubated on 08/15/2018 and was transferred to the floor on 08/16/2018.  Patient has had episodes of intermittent agitation.  He was transferred to [facility] on 08/17/2018.   Assessment & Plan:   Principal Problem:   MDD (major depressive disorder), recurrent severe, without psychosis ([place]) Active Problems:   Acute encephalopathy   Malnutrition of moderate degree  Acute toxic encephalopathy -Probably secondary to benzodiazepine overdose along with alcohol intoxication, now with alcohol withdrawal/DTs -Patient is off restraints currently. -Fall precautions -Use Ativan/Haldol as needed severe agitation.  Continue scheduled Valium -Diet as per SLP recommendations -Continue PT evaluation.  -Psychiatry evaluation appreciated: Recommend inpatient psychiatric hospitalization was medically cleared.  Will consult social worker for the same.   Alcohol abuse with alcohol intoxication and withdrawal -Continue CIWA protocol.  Continue thiamine, folic acid.  Acute hypoxic respiratory failure with probable aspiration -Resolved.  Patient has been extubated and is currently on room air. -Patient was briefly treated with antibiotics which have been discontinued  Leukocytosis -Resolved  Hypomagnesemia -Replace.  Repeat a.m. labs  Moderate malnutrition -Follow nutrition  recommendations  Essential hypertension -Home meds on hold.  Monitor blood pressure  Macrocytic anemia -Hemoglobin stable next  Orbital tumor -Outpatient follow-up with ophthalmologist  DVT prophylaxis: Heparin Code Status: Full Family Communication: None at bedside Disposition Plan: Inpatient psychiatric hospitalization once bed is available  Consultants: PCCM/psychiatry  Procedures: None  Antimicrobials: 1 dose of Zosyn on 08/13/2018.  Unasyn on 08/14/2018  Subjective: Patient seen and examined at bedside.  He is awake but still slightly confused to time.  He is a poor historian.  He wants to go home.  No overnight agitation reported by the nursing staff.  Objective: Vitals:   08/21/18 1100 08/21/18 1749 08/21/18 2045 08/22/18 0330  BP:  108/60 (!) 113/55 (!) 161/79  Pulse:  68 80 83  Resp: 20 18 20 20   Temp:  98 F (36.7 C) 98.7 F (37.1 C) 97.8 F (36.6 C)  TempSrc:  Oral Oral Oral  SpO2:  96% 95% 95%  Weight:      Height:        Intake/Output Summary (Last 24 hours) at 08/22/2018 1121 Last data filed at 08/22/2018 0331 Gross per 24 hour  Intake 3714.86 ml  Output 1600 ml  Net 2114.86 ml   Filed Weights   08/16/18 0122 08/17/18 0439 08/19/18 0412  Weight: 67.5 kg 63.4 kg 66 kg    Examination:  General exam: Appears calm and comfortable.  No distress.  Appears older than stated age.  Poor historian.  Awake but still slightly confused to time. Respiratory system: Bilateral decreased breath sounds at bases, no wheezing.  Cardiovascular system: S1 & S2 heard, rate controlled  gastrointestinal system: Abdomen is nondistended, soft and nontender. Normal bowel sounds heard. Extremities: No cyanosis, edema    Data Reviewed: I have personally reviewed following labs  and imaging studies  CBC: Recent Labs  Lab 08/18/18 0259 08/20/18 0227 08/22/18 0328  WBC 7.7 7.8 8.2  NEUTROABS  --   --  4.9  HGB 10.9* 11.6* 11.2*  HCT 33.5* 36.5* 34.6*  MCV 104.4*  103.4* 102.4*  PLT 169 229 875   Basic Metabolic Panel: Recent Labs  Lab 08/16/18 0225  08/18/18 0259 08/19/18 0310 08/20/18 0227 08/21/18 0322 08/22/18 0328  NA 144   < > 143 142 138 141 137  K 3.5   < > 3.4* 3.4* 3.8 4.6 4.6  CL 107   < > 107 106 103 109 102  CO2 27   < > 23 25 26 26 27   GLUCOSE 88   < > 80 92 92 73 115*  BUN 24*   < > 16 15 15 16  21*  CREATININE 1.13   < > 1.21 1.11 1.06 1.11 1.18  CALCIUM 9.1   < > 8.8* 8.9 8.9 9.4 9.8  MG 1.2*   < > 1.2* 1.1* 2.0 1.5* 1.4*  PHOS 2.9  --   --   --   --   --   --    < > = values in this interval not displayed.   GFR: Estimated Creatinine Clearance: 64.5 mL/min (by C-G formula based on SCr of 1.18 mg/dL). Liver Function Tests: Recent Labs  Lab 08/18/18 0259 08/20/18 0227 08/22/18 0328  AST 26 27 25   ALT 17 19 24   ALKPHOS 48 49 52  BILITOT 1.4* 0.8 0.4  PROT 6.4* 6.4* 6.1*  ALBUMIN 2.8* 3.0* 2.8*   No results for input(s): LIPASE, AMYLASE in the last 168 hours. No results for input(s): AMMONIA in the last 168 hours. Coagulation Profile: No results for input(s): INR, PROTIME in the last 168 hours. Cardiac Enzymes: No results for input(s): CKTOTAL, CKMB, CKMBINDEX, TROPONINI in the last 168 hours. BNP (last 3 results) No results for input(s): PROBNP in the last 8760 hours. HbA1C: No results for input(s): HGBA1C in the last 72 hours. CBG: Recent Labs  Lab 08/21/18 2048 08/21/18 2344 08/22/18 0327 08/22/18 0810 08/22/18 1116  GLUCAP 143* 112* 118* 97 118*   Lipid Profile: No results for input(s): CHOL, HDL, LDLCALC, TRIG, CHOLHDL, LDLDIRECT in the last 72 hours. Thyroid Function Tests: No results for input(s): TSH, T4TOTAL, FREET4, T3FREE, THYROIDAB in the last 72 hours. Anemia Panel: No results for input(s): VITAMINB12, FOLATE, FERRITIN, TIBC, IRON, RETICCTPCT in the last 72 hours. Sepsis Labs: No results for input(s): PROCALCITON, LATICACIDVEN in the last 168 hours.  Recent Results (from the past 240  hour(s))  Blood culture (routine x 2)     Status: None   Collection Time: 08/13/18  5:45 PM  Result Value Ref Range Status   Specimen Description BLOOD RIGHT ANTECUBITAL  Final   Special Requests   Final    BOTTLES DRAWN AEROBIC AND ANAEROBIC Blood Culture results may not be optimal due to an excessive volume of blood received in culture bottles   Culture   Final    NO GROWTH 5 DAYS Performed at [facility], [place] [address], [place], [place] [address]    Report Status 08/18/2018 FINAL  Final  Blood culture (routine x 2)     Status: None   Collection Time: 08/13/18  6:00 PM  Result Value Ref Range Status   Specimen Description BLOOD RIGHT HAND  Final   Special Requests   Final    BOTTLES DRAWN AEROBIC AND ANAEROBIC Blood Culture  results may not be optimal due to an excessive volume of blood received in culture bottles   Culture   Final    NO GROWTH 5 DAYS Performed at [facility], [place] [address] [place], [place] [address]    Report Status 08/18/2018 FINAL  Final  MRSA PCR Screening     Status: None   Collection Time: 08/13/18  6:52 PM  Result Value Ref Range Status   MRSA by PCR NEGATIVE NEGATIVE Final    Comment:        The GeneXpert MRSA Assay (FDA approved for NASAL specimens only), is one component of a comprehensive MRSA colonization surveillance program. It is not intended to diagnose MRSA infection nor to guide or monitor treatment for MRSA infections. Performed at [facility], [place] [address], [place], [place] [address]          Radiology Studies: No results found.      Scheduled Meds: . diazepam  2.5 mg Intravenous BID   Or  . diazepam  5 mg Oral BID  . feeding supplement (ENSURE ENLIVE)  237 mL Oral TID BM  . folic acid  1 mg Oral Daily  . heparin  5,000 Units Subcutaneous Q8H  . Influenza vac split quadrivalent PF  0.5 mL Intramuscular Tomorrow-1000  . insulin aspart  0-15 Units Subcutaneous Q4H  . LORazepam  2 mg Intravenous  Once  . mouth rinse  15 mL Mouth Rinse BID  . nicotine  21 mg Transdermal Daily  . thiamine  100 mg Oral Daily   Continuous Infusions:    LOS: 9 days        Aline August, MD [facility] Pager [phone]  If 7PM-7AM, please contact night-coverage [email or web address] Password [facility] 08/22/2018, 11:21 AM

## 2018-08-22 NOTE — Progress Notes (Signed)
CSW consulted with [facility] this morning. Patient would need to be out of restraints, ambulatory, and oriented in order to participate in programming at [facility]. May also need to consider geri-psych placement if [facility] cannot accept patient.  CSW met with patient at bedside. Patient alert and oriented and engaged in conversation with CSW. Patient was tearful when talking about his wife. CSW discussed psych recommendations for inpatient psych treatment. Patient understanding of recommendations, though states he does not want to end his life anymore. Patient is concerned about caring for his elderly mother, who he usually visits and assists. CSW provided reflective listening and empathetic presence.   CSW advised patient that psychiatry has recommended inpatient psych and CSW going ahead with psych referrals. Did note patient's progress today and referred to [facility]. Awaiting call back.   CSW to follow.  Jeremiah Tucker, [place]

## 2018-08-22 NOTE — Progress Notes (Signed)
  Speech Language Pathology Treatment: Dysphagia  Patient Details Name: Jeremiah Tucker MRN: [id number] DOB: 1961/01/05 Today's Date: 08/22/2018 Time: 1001-1013 SLP Time Calculation (min) (ACUTE ONLY): 12 min  Assessment / Plan / Recommendation Clinical Impression  Pt had one delayed cough across PO intake, needing Mod cues for use of second swallow. He does take small bites and sips with Mod I, and uses appropriate pacing for self-feeding. Would maintain current diet for now with additional SLP f/u for readiness to progress to more advanced diet textures.   HPI HPI: 57 year old male with h/o of hypertension and alcohol abuse was brought to the emergency room after being found obtunded, minimally responsive, was suspected to be secondary to drug overdose, benzos noted in urine drug screen, alcohol intoxication, intubated on 11/13, extubated 11/15. Now in DTs.       SLP Plan  Continue with current plan of care       Recommendations  Diet recommendations: Dysphagia 2 (fine chop);Thin liquid Liquids provided via: Cup;Straw Medication Administration: Whole meds with puree Supervision: Staff to assist with self feeding;Full supervision/cueing for compensatory strategies Compensations: Slow rate;Small sips/bites;Minimize environmental distractions;Multiple dry swallows after each bite/sip Postural Changes and/or Swallow Maneuvers: Seated upright 90 degrees                Oral Care Recommendations: Oral care BID Follow up Recommendations: Skilled Nursing facility SLP Visit Diagnosis: Dysphagia, oropharyngeal phase (R13.12) Plan: Continue with current plan of care       GO                Germain Osgood 08/22/2018, 11:32 AM  Germain Osgood, M.A. [place] Acute Environmental education officer [phone] Office [phone]

## 2018-08-22 NOTE — Care Management Note (Signed)
Case Management Note Marvetta Gibbons RN, BSN Transitions of Care Unit 4E- RN Case Manager [phone]  Patient Details  Name: Jeremiah Tucker MRN: [id number] Date of Birth: 04/02/1961  Subjective/Objective:  Pt admitted after being found down, OD with benzos, ETOH, intubated on admission- ext 11/15                  Action/Plan: PTA pt lived at home, wife recently passed on 2023-08-19. Have requested MD to consult Psych on 11/21. Per PT eval recommendation for SNF- CSW following for transition needs  Expected Discharge Date:                  Expected Discharge Plan:  [facility]  In-House Referral:  Clinical Social Work  Discharge planning Services  CM Consult  Post Acute Care Choice:    Choice offered to:     DME Arranged:    DME Agency:     HH Arranged:    [place] Agency:     Status of Service:  In process, will continue to follow  If discussed at Long Length of Stay Meetings, dates discussed:    Additional Comments:  08/22/18- 1020- Raenette Sakata RN, CM- per psych eval recommendation for INPT psych - CSW following for inpt psych placement when pt medically stable for transition. Pt will need to be ambulatory, oriented, and out of restraints- have spoken to bedside RN- Juliann Pulse regarding goals for transition.   Dawayne Patricia, RN 08/22/2018, 10:18 AM

## 2018-08-22 NOTE — Progress Notes (Signed)
Physical Therapy Treatment Patient Details Name: Jeremiah Tucker MRN: [id number] DOB: 09-07-1961 Today's Date: 08/22/2018    History of Present Illness 57 year old male with h/o of hypertension and alcohol abuse was brought to the emergency room after being found obtunded, minimally responsive, was suspected to be secondary to drug overdose, benzos noted in urine drug screen, alcohol intoxication, intubated on 11/13, extubated 11/15. Now in DTs.    PT Comments    Patient with noted improvement in cognition and balance this session, although still presenting with decreased awareness of safety and deficits. Ambulating hallway distances with walker and min guard assist. Displaying mild unsteadiness and slow speed. HR 94-114 bpm. Will continue to progress mobility.   Follow Up Recommendations  Supervision/Assistance - 24 hour;SNF (or defer to inpatient psych)     Equipment Recommendations  Rolling walker with 5" wheels    Recommendations for Other Services       Precautions / Restrictions Precautions Precautions: Fall Restrictions Weight Bearing Restrictions: No    Mobility  Bed Mobility Overal bed mobility: Modified Independent Bed Mobility: Supine to Sit     Supine to sit: Modified independent (Device/Increase time)        Transfers Overall transfer level: Needs assistance Equipment used: Rolling walker (2 wheeled) Transfers: Sit to/from Stand Sit to Stand: Supervision         General transfer comment: Supervision for safety  Ambulation/Gait Ambulation/Gait assistance: Min guard Gait Distance (Feet): 200 Feet Assistive device: Rolling walker (2 wheeled) Gait Pattern/deviations: Step-through pattern;Decreased stride length;Trunk flexed Gait velocity: decreased   General Gait Details: Cues for looking up and walker proximity. Improved foot clearance this session and no ataxia noted   Stairs             Wheelchair Mobility    Modified Rankin  (Stroke Patients Only)       Balance Overall balance assessment: Needs assistance Sitting-balance support: Feet supported Sitting balance-Leahy Scale: Good     Standing balance support: During functional activity Standing balance-Leahy Scale: Fair                              Cognition Arousal/Alertness: Awake/alert Behavior During Therapy: WFL for tasks assessed/performed Overall Cognitive Status: Impaired/Different from baseline Area of Impairment: Attention;Memory;Following commands;Awareness;Safety/judgement                   Current Attention Level: Selective Memory: Decreased short-term memory Following Commands: Follows one step commands consistently Safety/Judgement: Decreased awareness of safety;Decreased awareness of deficits Awareness: Emergent   General Comments: Patient with improved awareness and attention this session. Still with poor awareness of deficits and not understanding why he cannot go home      Exercises      General Comments        Pertinent Vitals/Pain Pain Assessment: Faces Faces Pain Scale: No hurt    Home Living                      Prior Function            PT Goals (current goals can now be found in the care plan section) Acute Rehab PT Goals Patient Stated Goal: to go home tomorrow PT Goal Formulation: With patient Time For Goal Achievement: 09/02/18 Potential to Achieve Goals: Good Progress towards PT goals: Progressing toward goals    Frequency    Min 3X/week      PT Plan Current  plan remains appropriate    Co-evaluation              AM-PAC PT "6 Clicks" Daily Activity  Outcome Measure  Difficulty turning over in bed (including adjusting bedclothes, sheets and blankets)?: None Difficulty moving from lying on back to sitting on the side of the bed? : None Difficulty sitting down on and standing up from a chair with arms (e.g., wheelchair, bedside commode, etc,.)?: A Little Help  needed moving to and from a bed to chair (including a wheelchair)?: A Little Help needed walking in hospital room?: A Little Help needed climbing 3-5 steps with a railing? : A Little 6 Click Score: 20    End of Session Equipment Utilized During Treatment: Gait belt Activity Tolerance: Patient tolerated treatment well Patient left: in chair;with call bell/phone within reach;with chair alarm set(telesitter) Nurse Communication: Mobility status PT Visit Diagnosis: Unsteadiness on feet (R26.81);Difficulty in walking, not elsewhere classified (R26.2);Other symptoms and signs involving the nervous system (B34.037)     Time: 0964-3838 PT Time Calculation (min) (ACUTE ONLY): 14 min  Charges:  $Gait Training: 8-22 mins                    Jeremiah Tucker, [place], DPT Acute Rehabilitation Services Pager [phone] Office [phone]    Willy Eddy 08/22/2018, 11:38 AM

## 2018-08-23 LAB — CBC WITH DIFFERENTIAL/PLATELET
Abs Immature Granulocytes: 0.06 10*3/uL (ref 0.00–0.07)
BASOS ABS: 0.1 10*3/uL (ref 0.0–0.1)
Basophils Relative: 1 %
Eosinophils Absolute: 0.2 10*3/uL (ref 0.0–0.5)
Eosinophils Relative: 2 %
HEMATOCRIT: 36.3 % — AB (ref 39.0–52.0)
HEMOGLOBIN: 11.7 g/dL — AB (ref 13.0–17.0)
Immature Granulocytes: 1 %
LYMPHS PCT: 23 %
Lymphs Abs: 1.9 10*3/uL (ref 0.7–4.0)
MCH: 32.8 pg (ref 26.0–34.0)
MCHC: 32.2 g/dL (ref 30.0–36.0)
MCV: 101.7 fL — AB (ref 80.0–100.0)
MONOS PCT: 12 %
Monocytes Absolute: 1 10*3/uL (ref 0.1–1.0)
Neutro Abs: 5.1 10*3/uL (ref 1.7–7.7)
Neutrophils Relative %: 61 %
Platelets: 268 10*3/uL (ref 150–400)
RBC: 3.57 MIL/uL — ABNORMAL LOW (ref 4.22–5.81)
RDW: 13.1 % (ref 11.5–15.5)
WBC: 8.2 10*3/uL (ref 4.0–10.5)
nRBC: 0 % (ref 0.0–0.2)

## 2018-08-23 LAB — BASIC METABOLIC PANEL
ANION GAP: 9 (ref 5–15)
BUN: 21 mg/dL — ABNORMAL HIGH (ref 6–20)
CHLORIDE: 99 mmol/L (ref 98–111)
CO2: 25 mmol/L (ref 22–32)
Calcium: 9.5 mg/dL (ref 8.9–10.3)
Creatinine, Ser: 1.3 mg/dL — ABNORMAL HIGH (ref 0.61–1.24)
GFR calc non Af Amer: 59 mL/min — ABNORMAL LOW (ref >60)
GLUCOSE: 113 mg/dL — AB (ref 70–99)
Potassium: 4.6 mmol/L (ref 3.5–5.1)
Sodium: 133 mmol/L — ABNORMAL LOW (ref 135–145)

## 2018-08-23 LAB — MAGNESIUM: Magnesium: 1.7 mg/dL (ref 1.7–2.4)

## 2018-08-23 LAB — GLUCOSE, CAPILLARY
GLUCOSE-CAPILLARY: 107 mg/dL — AB (ref 70–99)
GLUCOSE-CAPILLARY: 103 mg/dL — AB (ref 70–99)
Glucose-Capillary: 123 mg/dL — ABNORMAL HIGH (ref 70–99)
Glucose-Capillary: 122 mg/dL — ABNORMAL HIGH (ref 70–99)

## 2018-08-23 MED ORDER — ADULT MULTIVITAMIN W/MINERALS CH: 1.0000 | ORAL_TABLET | Freq: Every day | ORAL | Status: AC

## 2018-08-23 MED ORDER — THIAMINE HCL 100 MG PO TABS: 100.0000 mg | ORAL_TABLET | Freq: Every day | ORAL | 0 refills | Status: AC

## 2018-08-23 MED ORDER — FOLIC ACID 1 MG PO TABS: 1.0000 mg | ORAL_TABLET | Freq: Every day | ORAL | 0 refills | Status: AC

## 2018-08-23 NOTE — Progress Notes (Signed)
[facility] will not accept patient due to current funtional baseline and recommending geri inpateint psyc. Faxed out referral to [facility] which is closest facility with geri inpatient psyc unit. Facility will review notes. No avaliable beds there at this time.

## 2018-08-23 NOTE — Discharge Summary (Addendum)
Physician Discharge Summary  Jeremiah Tucker CVE:[id number] DOB: June 01, 1961 DOA: 08/13/2018  PCP: [name], MD  Admit date: 08/13/2018 Discharge date: 08/23/2018  Admitted From: Home Disposition: Psychiatric facility  Recommendations for Outpatient Follow-up:  1. Follow up with PCP in 1 week 2. Follow-up with provider at psychiatric facility upon discharge   [place]: No Equipment/Devices: None Discharge Condition: Stable CODE STATUS: Full Diet recommendation: Heart Healthy  Brief/Interim Summary: 57 year old male with history of alcohol abuse, hypertension presented on 08/13/2018 with altered mental status and was intubated in the ER.  He was admitted to ICU under critical care service.  Urine drug screen was positive for benzodiazepines.  Patient's wife had passed away 1 week prior to admission.  Patient was also started on Unasyn for probable aspiration pneumonia.  EEG was negative for epileptiform activity.  He was extubated on 08/15/2018 and was transferred to the floor on 08/16/2018.  Patient has had episodes of intermittent agitation.  He was transferred to [facility] on 08/17/2018.  PT recommended SNF placement.  Psychiatry recommended inpatient psychiatric hospitalization.  He will be discharged to psychiatric facility once bed is available.  Addendum: Patient is waiting to be discharged to [facility] psych unit.  He is medically stable for discharge.    Discharge Diagnoses:  Principal Problem:   MDD (major depressive disorder), recurrent severe, without psychosis ([place]) Active Problems:   Acute encephalopathy   Malnutrition of moderate degree  Acute toxic encephalopathy -Probably secondary to benzodiazepine overdose along with alcohol intoxication, now with alcohol withdrawal/DTs -Patient is off restraints currently.  Mental status is much more stable. -Fall precautions.  Continue bedside sitter -Use Ativan/Haldol as needed severe agitation.  Continue scheduled Valium while  patient is still in the hospital. -Diet as per SLP recommendations -Continue PT evaluation.  -Psychiatry evaluation appreciated: Recommend inpatient psychiatric hospitalization when medically cleared.  -Patient is medically stable for discharge to a psychiatric facility.  Discharge once bed is available.  Social worker consulted.  Alcohol abuse with alcohol intoxication and withdrawal -Currently on CIWA protocol.  Continue thiamine, folic acid and multivitamin on discharge. -Much improved.  Acute hypoxic respiratory failure with probable aspiration -Resolved.  Patient has been extubated and is currently on room air. -Patient was briefly treated with antibiotics which have been discontinued  Leukocytosis -Resolved  Hypomagnesemia -Replaced.    Improved.  Moderate malnutrition -Follow nutrition recommendations  Essential hypertension -Home meds on hold.  Monitor blood pressure.  Outpatient follow-up  Macrocytic anemia -Hemoglobin stable.  Outpatient follow-up  Orbital tumor -Outpatient follow-up with ophthalmologist  Discharge Instructions  Discharge Instructions    Call MD for:  difficulty breathing, headache or visual disturbances   Complete by:  As directed    Call MD for:  extreme fatigue   Complete by:  As directed    Call MD for:  hives   Complete by:  As directed    Call MD for:  persistant dizziness or light-headedness   Complete by:  As directed    Call MD for:  persistant nausea and vomiting   Complete by:  As directed    Call MD for:  severe uncontrolled pain   Complete by:  As directed    Call MD for:  temperature >100.4   Complete by:  As directed    Diet - low sodium heart healthy   Complete by:  As directed    Increase activity slowly   Complete by:  As directed      Allergies as of 08/23/2018  No Known Allergies     Medication List    STOP taking these medications   olmesartan-hydrochlorothiazide 40-12.5 MG tablet Commonly known  as:  BENICAR HCT   potassium chloride 10 MEQ CR capsule Commonly known as:  MICRO-K     TAKE these medications   allopurinol 300 MG tablet Commonly known as:  ZYLOPRIM Take 300 mg by mouth daily.   folic acid 1 MG tablet Commonly known as:  FOLVITE Take 1 tablet (1 mg total) by mouth daily. Start taking on:  08/24/2018   lovastatin 40 MG tablet Commonly known as:  MEVACOR Take 40 mg by mouth at bedtime.   multivitamin with minerals Tabs tablet Take 1 tablet by mouth daily.   thiamine 100 MG tablet Take 1 tablet (100 mg total) by mouth daily. Start taking on:  08/24/2018      Follow-up Information    [facility] Follow up.   Why:  Grief counseling. Call [phone]. Contact information: [facility] Follow up.   Specialty:  Professional Counselor Why:  mental health and substance use counseling Contact information: [facility] [place] [address] [phone]        [name], MD. Schedule an appointment as soon as possible for a visit in 1 week(s).   Specialty:  Internal Medicine Contact information: [address]  [place] [address] [phone]          No Known Allergies  Consultations:  PCCM/psychiatry   Procedures/Studies: Ct Head Wo Contrast  Result Date: 08/13/2018 CLINICAL DATA:  Altered mental status. EXAM: CT HEAD WITHOUT CONTRAST TECHNIQUE: Contiguous axial images were obtained from the base of the skull through the vertex without intravenous contrast. COMPARISON:  12/31/2015 FINDINGS: Brain: No evidence of acute infarction, hemorrhage, hydrocephalus, extra-axial collection or mass lesion/mass effect. There is ventricular and sulcal enlargement reflecting atrophy advanced for age. Vascular: No hyperdense vessel or unexpected calcification. Skull: Normal. Negative  for fracture or focal lesion. Sinuses/Orbits: Homogeneous, 16 x 10 x 12 mm mass in the left orbit abutting the inferior rectus. Globes and orbits otherwise unremarkable. Mild left maxillary sinus mucosal thickening. Mild right ethmoid and left maxillary sinus mucosal thickening. Clear mastoid air cells. Right-sided nasal cannula. Other: None. IMPRESSION: 1. No acute intracranial abnormalities. 2. Atrophy advanced for age. 3. 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 It measures 16 mm in greatest dimension. This would be better characterized with orbital MRI with and without contrast. Electronically Signed   By: Lajean Manes M.D.   On: 08/13/2018 15:27   Dg Chest Port 1 View  Result Date: 08/15/2018 CLINICAL DATA:  Hypoxia EXAM: PORTABLE CHEST 1 VIEW COMPARISON:  August 13, 2018 FINDINGS: Endotracheal tube tip is 5.0 cm above the carina. Nasogastric tube tip is in the stomach with the side port essentially at the gastroesophageal junction. No pneumothorax. No edema or consolidation. Heart size and pulmonary vascularity are normal. No adenopathy. There is aortic atherosclerosis. There old healed rib fractures on the right. IMPRESSION: Tube and catheter positions as described without pneumothorax. Nasogastric tube side port is at the gastroesophageal junction. Advise advancing nasogastric tube 4-5 cm to ensure both tube tip and side port are within the stomach. No edema or consolidation. Heart size normal. There is aortic atherosclerosis. Aortic Atherosclerosis (ICD10-I70.0). Electronically Signed   By:  Lowella Grip III M.D.   On: 08/15/2018 07:29   Dg Chest Portable 1 View  Result Date: 08/13/2018 CLINICAL DATA:  Status post intubation EXAM: PORTABLE CHEST 1 VIEW COMPARISON:  08/13/2018 FINDINGS: Endotracheal tube with the tip 3.5 cm above the carina. Nasogastric tube coursing below the diaphragm. Elevation of the left diaphragm. Mild left basilar  airspace disease likely reflecting atelectasis versus pneumonia. No focal consolidation. No pleural effusion or pneumothorax. Stable cardiomediastinal silhouette. No acute osseous abnormality. IMPRESSION: 1. Endotracheal tube with the tip 3.5 cm of the the carina. Nasogastric tube coursing below the diaphragm. 2. Mild left basilar airspace disease which may reflect atelectasis versus pneumonia. Electronically Signed   By: Kathreen Devoid   On: 08/13/2018 17:11   Dg Chest Portable 1 View  Result Date: 08/13/2018 CLINICAL DATA:  Did dyspnea and altered mentation EXAM: PORTABLE CHEST 1 VIEW COMPARISON:  05/19/2013 FINDINGS: Emphysematous hyperinflation of the lungs. Normal heart size. Aortic atherosclerosis at the arch without aneurysm. Remote right-sided sixth and seventh rib fractures with healing. No acute osseous abnormality. No pulmonary consolidation or edema. Blunting of the left lateral costophrenic angle is noted, nonspecific but may reflect an area of atelectasis, scarring and/or trace effusion. IMPRESSION: 1. Pulmonary hyperinflation. 2. Blunting of the left lateral costophrenic angle as above possibly related to atelectasis, scarring and/or trace effusion. 3. Aortic atherosclerosis. Electronically Signed   By: Ashley Royalty M.D.   On: 08/13/2018 13:38   Mr Rosealee Albee OF Contrast  Result Date: 08/14/2018 CLINICAL DATA:  Mass lesion left orbit EXAM: MRI OF THE ORBITS WITHOUT AND WITH CONTRAST TECHNIQUE: Multiplanar, multisequence MR imaging of the orbits was performed both before and after the administration of intravenous contrast. CONTRAST:  6 mL Gadovist IV COMPARISON:  CT head 08/13/2018, 12/31/2015 FINDINGS: Two mass lesions in the left inferior orbit. These are well circumscribed and homogeneous low signal on T1 and high signal on T2. Larger proximal lesion is intraconal and lateral and superior to the inferior rectus. The smaller anterior lesion is located immediately lateral to the inferior rectus.  The lesion show mild-to-moderate heterogeneous enhancement. The larger proximal lesion measures 10 x 14 mm and the smaller distal lesion measures 9 mm in diameter. These are not associated with the optic nerve. Extraocular muscles normal. Optic nerve normal. No bony changes identified. The globe is normal bilaterally. Orbital fat otherwise normal. Lacrimal gland normal. Optic chiasm normal.  Cavernous sinus normal bilaterally. Limited intracranial imaging reveals moderate atrophy. 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 Mucosal edema paranasal sinuses without air-fluid level. IMPRESSION: Two masses lesions in the left inferior orbit are nearly contiguous. These are well circumscribed with heterogeneous enhancement. Review of prior studies reveals that at least the larger lesion is unchanged from 2017. The lesions are separate from the muscles and optic nerve. 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 Aggressive tumor such as metastatic disease not considered likely given the stability over time. Hemangioma considered less likely. No other lesions. Electronically Signed   By: Franchot Gallo M.D.   On: 08/14/2018 18:16     Subjective: Patient seen and examined at bedside.  He is more awake and answers some questions but is a poor historian.  No current signs of agitation.  He looks calm.  No overnight fever or vomiting reported.  Discharge Exam: Vitals:   08/22/18 2346 08/23/18 0411  BP: (!) 109/56 119/69  Pulse:    Resp: 18 16  Temp: 98.2 F (36.8 C) 98  F (36.7 C)  SpO2: 97%    Vitals:   08/22/18 0330 08/22/18 1316 08/22/18 2346 08/23/18 0411  BP: (!) 161/79 117/70 (!) 109/56 119/69  Pulse: 83 83    Resp: 20 19 18 16   Temp: 97.8 F (36.6 C) 98 F (36.7 C) 98.2 F (36.8 C) 98 F (36.7 C)  TempSrc: Oral Oral Oral Oral  SpO2: 95% 95% 97%   Weight:      Height:        General: Pt is awake, answers some questions.  Poor historian.  No signs of any  agitation.  Looks calm Cardiovascular: rate controlled, S1/S2 + Respiratory: bilateral decreased breath sounds at bases Abdominal: Soft, NT, ND, bowel sounds + Extremities: no edema, no cyanosis    The results of significant diagnostics from this hospitalization (including imaging, microbiology, ancillary and laboratory) are listed below for reference.     Microbiology: Recent Results (from the past 240 hour(s))  Blood culture (routine x 2)     Status: None   Collection Time: 08/13/18  5:45 PM  Result Value Ref Range Status   Specimen Description BLOOD RIGHT ANTECUBITAL  Final   Special Requests   Final    BOTTLES DRAWN AEROBIC AND ANAEROBIC Blood Culture results may not be optimal due to an excessive volume of blood received in culture bottles   Culture   Final    NO GROWTH 5 DAYS Performed at [facility], [place] [address], [place], [place] [address]    Report Status 08/18/2018 FINAL  Final  Blood culture (routine x 2)     Status: None   Collection Time: 08/13/18  6:00 PM  Result Value Ref Range Status   Specimen Description BLOOD RIGHT HAND  Final   Special Requests   Final    BOTTLES DRAWN AEROBIC AND ANAEROBIC Blood Culture results may not be optimal due to an excessive volume of blood received in culture bottles   Culture   Final    NO GROWTH 5 DAYS Performed at [facility], [place] [address], [place], [place] [address]    Report Status 08/18/2018 FINAL  Final  MRSA PCR Screening     Status: None   Collection Time: 08/13/18  6:52 PM  Result Value Ref Range Status   MRSA by PCR NEGATIVE NEGATIVE Final    Comment:        The GeneXpert MRSA Assay (FDA approved for NASAL specimens only), is one component of a comprehensive MRSA colonization surveillance program. It is not intended to diagnose MRSA infection nor to guide or monitor treatment for MRSA infections. Performed at [facility], [place] [address] [place], [place] [address]      Labs: BNP  (last 3 results) No results for input(s): BNP in the last 8760 hours. Basic Metabolic Panel: Recent Labs  Lab 08/19/18 0310 08/20/18 0227 08/21/18 0322 08/22/18 0328 08/23/18 0312  NA 142 138 141 137 133*  K 3.4* 3.8 4.6 4.6 4.6  CL 106 103 109 102 99  CO2 25 26 26 27 25   GLUCOSE 92 92 73 115* 113*  BUN 15 15 16  21* 21*  CREATININE 1.11 1.06 1.11 1.18 1.30*  CALCIUM 8.9 8.9 9.4 9.8 9.5  MG 1.1* 2.0 1.5* 1.4* 1.7   Liver Function Tests: Recent Labs  Lab 08/18/18 0259 08/20/18 0227 08/22/18 0328  AST 26 27 25   ALT 17 19 24   ALKPHOS 48 49 52  BILITOT 1.4* 0.8 0.4  PROT  6.4* 6.4* 6.1*  ALBUMIN 2.8* 3.0* 2.8*   No results for input(s): LIPASE, AMYLASE in the last 168 hours. No results for input(s): AMMONIA in the last 168 hours. CBC: Recent Labs  Lab 08/18/18 0259 08/20/18 0227 08/22/18 0328 08/23/18 0312  WBC 7.7 7.8 8.2 8.2  NEUTROABS  --   --  4.9 5.1  HGB 10.9* 11.6* 11.2* 11.7*  HCT 33.5* 36.5* 34.6* 36.3*  MCV 104.4* 103.4* 102.4* 101.7*  PLT 169 229 241 268   Cardiac Enzymes: No results for input(s): CKTOTAL, CKMB, CKMBINDEX, TROPONINI in the last 168 hours. BNP: Invalid input(s): POCBNP CBG: Recent Labs  Lab 08/22/18 1116 08/22/18 1625 08/22/18 2014 08/22/18 2344 08/23/18 0408  GLUCAP 118* 88 116* 107* 123*   D-Dimer No results for input(s): DDIMER in the last 72 hours. Hgb A1c No results for input(s): HGBA1C in the last 72 hours. Lipid Profile No results for input(s): CHOL, HDL, LDLCALC, TRIG, CHOLHDL, LDLDIRECT in the last 72 hours. Thyroid function studies No results for input(s): TSH, T4TOTAL, T3FREE, THYROIDAB in the last 72 hours.  Invalid input(s): FREET3 Anemia work up No results for input(s): VITAMINB12, FOLATE, FERRITIN, TIBC, IRON, RETICCTPCT in the last 72 hours. Urinalysis    Component Value Date/Time   COLORURINE YELLOW 08/13/2018 1504   APPEARANCEUR CLEAR 08/13/2018 1504   LABSPEC 1.016 08/13/2018 1504   PHURINE 5.0  08/13/2018 1504   GLUCOSEU NEGATIVE 08/13/2018 1504   HGBUR NEGATIVE 08/13/2018 1504   BILIRUBINUR NEGATIVE 08/13/2018 1504   KETONESUR 5 (A) 08/13/2018 1504   PROTEINUR NEGATIVE 08/13/2018 1504   NITRITE NEGATIVE 08/13/2018 1504   LEUKOCYTESUR NEGATIVE 08/13/2018 1504   Sepsis Labs Invalid input(s): PROCALCITONIN,  WBC,  LACTICIDVEN Microbiology Recent Results (from the past 240 hour(s))  Blood culture (routine x 2)     Status: None   Collection Time: 08/13/18  5:45 PM  Result Value Ref Range Status   Specimen Description BLOOD RIGHT ANTECUBITAL  Final   Special Requests   Final    BOTTLES DRAWN AEROBIC AND ANAEROBIC Blood Culture results may not be optimal due to an excessive volume of blood received in culture bottles   Culture   Final    NO GROWTH 5 DAYS Performed at [facility], [place] [address], [place], [place] [address]    Report Status 08/18/2018 FINAL  Final  Blood culture (routine x 2)     Status: None   Collection Time: 08/13/18  6:00 PM  Result Value Ref Range Status   Specimen Description BLOOD RIGHT HAND  Final   Special Requests   Final    BOTTLES DRAWN AEROBIC AND ANAEROBIC Blood Culture results may not be optimal due to an excessive volume of blood received in culture bottles   Culture   Final    NO GROWTH 5 DAYS Performed at [facility], [place] [address], [place], [place] [address]    Report Status 08/18/2018 FINAL  Final  MRSA PCR Screening     Status: None   Collection Time: 08/13/18  6:52 PM  Result Value Ref Range Status   MRSA by PCR NEGATIVE NEGATIVE Final    Comment:        The GeneXpert MRSA Assay (FDA approved for NASAL specimens only), is one component of a comprehensive MRSA colonization surveillance program. It is not intended to diagnose MRSA infection nor to guide or monitor treatment for MRSA infections. Performed at [facility], [place] [address] [place], [place] [address]  Time coordinating discharge: 35  minutes  SIGNED:   Aline August, MD  [facility] 08/23/2018, 9:23 AM Pager: [phone]  If 7PM-7AM, please contact night-coverage [email or web address] Password [email or web address]

## 2018-08-24 LAB — GLUCOSE, CAPILLARY
Glucose-Capillary: 102 mg/dL — ABNORMAL HIGH (ref 70–99)
Glucose-Capillary: 116 mg/dL — ABNORMAL HIGH (ref 70–99)
Glucose-Capillary: 116 mg/dL — ABNORMAL HIGH (ref 70–99)

## 2018-08-24 NOTE — Progress Notes (Signed)
Patient ID: Jeremiah Tucker, male   DOB: 12-20-1960, 57 y.o.   MRN: [id number] Patient is waiting to be discharged to [facility] psych unit.  He is medically stable for discharge.  Please refer to the discharge summary done by me on 08/23/2018 for full details.

## 2018-08-25 DIAGNOSIS — F4325 Adjustment disorder with mixed disturbance of emotions and conduct: Secondary | ICD-10-CM

## 2018-08-25 MED ORDER — ADULT MULTIVITAMIN W/MINERALS CH
1.0000 | ORAL_TABLET | Freq: Every day | ORAL | Status: DC
  Administered 2018-08-25 – 2018-08-26 (×2): 1 via ORAL
  Filled 2018-08-25 (×2): qty 1

## 2018-08-25 NOTE — Evaluation (Signed)
Physical Therapy Evaluation Patient Details Name: Jeremiah Tucker MRN: [id number] DOB: 01-25-1961 Today's Date: 08/25/2018   History of Present Illness  57 year old male with h/o of hypertension and alcohol abuse was brought to the emergency room after being found obtunded, minimally responsive, was suspected to be secondary to drug overdose, benzos noted in urine drug screen, alcohol intoxication, intubated on 11/13, extubated 11/15. Now in DTs.  Clinical Impression  Patient seen for activity progression, mobilizing well today with modest instability and no need for device. At this time, feel patient would be appropriate to d/c to behavioral health services. Will continue to see acutely during stay for activity progression.    Follow Up Recommendations Supervision/Assistance - 24 hour    Equipment Recommendations       Recommendations for Other Services       Precautions / Restrictions Precautions Precautions: Fall Restrictions Weight Bearing Restrictions: No      Mobility  Bed Mobility Overal bed mobility: Modified Independent Bed Mobility: Supine to Sit     Supine to sit: Modified independent (Device/Increase time)        Transfers Overall transfer level: Needs assistance Equipment used: None Transfers: Sit to/from Stand Sit to Stand: Supervision         General transfer comment: supervision for safety, no device today  Ambulation/Gait Ambulation/Gait assistance: Supervision Gait Distance (Feet): 610 Feet Assistive device: None Gait Pattern/deviations: Step-through pattern;Decreased stride length;Trunk flexed Gait velocity: decreased Gait velocity interpretation: <1.8 ft/sec, indicate of risk for recurrent falls General Gait Details: 3 noted LOB with head turns in conversation, otherwise improvemetns in stability noted  Stairs            Wheelchair Mobility    Modified Rankin (Stroke Patients Only)       Balance Overall balance assessment:  Needs assistance Sitting-balance support: Feet supported Sitting balance-Leahy Scale: Good     Standing balance support: During functional activity Standing balance-Leahy Scale: Fair                               Pertinent Vitals/Pain Faces Pain Scale: No hurt    Home Living                        Prior Function                 Hand Dominance        Extremity/Trunk Assessment                Communication      Cognition Arousal/Alertness: Awake/alert Behavior During Therapy: WFL for tasks assessed/performed Overall Cognitive Status: Impaired/Different from baseline Area of Impairment: Attention;Memory;Following commands;Awareness;Safety/judgement                   Current Attention Level: Alternating   Following Commands: Follows one step commands consistently;Follows multi-step commands consistently   Awareness: Emergent   General Comments: Patient with improvements in overall insight and functional status as well as memory today      General Comments      Exercises     Assessment/Plan    PT Assessment    PT Problem List         PT Treatment Interventions      PT Goals (Current goals can be found in the Care Plan section)  Acute Rehab PT Goals Patient Stated Goal: to go home tomorrow PT Goal Formulation: With  patient Time For Goal Achievement: 09/02/18 Potential to Achieve Goals: Good    Frequency Min 3X/week   Barriers to discharge        Co-evaluation               AM-PAC PT "6 Clicks" Mobility  Outcome Measure Help needed turning from your back to your side while in a flat bed without using bedrails?: None Help needed moving from lying on your back to sitting on the side of a flat bed without using bedrails?: None Help needed moving to and from a bed to a chair (including a wheelchair)?: A Little Help needed standing up from a chair using your arms (e.g., wheelchair or bedside chair)?: A  Little Help needed to walk in hospital room?: A Little Help needed climbing 3-5 steps with a railing? : A Little 6 Click Score: 20    End of Session Equipment Utilized During Treatment: Gait belt Activity Tolerance: Patient tolerated treatment well Patient left: in bed;with call bell/phone within reach(sitting EOB with sitter present) Nurse Communication: Mobility status PT Visit Diagnosis: Unsteadiness on feet (R26.81);Difficulty in walking, not elsewhere classified (R26.2);Other symptoms and signs involving the nervous system (X91.478)    Time: 2956-2130 PT Time Calculation (min) (ACUTE ONLY): 15 min   Charges:     PT Treatments $Gait Training: 8-22 mins        Alben Deeds, PT DPT  Board Certified Neurologic Specialist [place] Pager [phone] Office [phone]   Duncan Dull 08/25/2018, 9:24 AM

## 2018-08-25 NOTE — Clinical Social Work Note (Signed)
MD has reconsulted psych to see if patient is still needing inpatient placement.  Dayton Scrape, [place]

## 2018-08-25 NOTE — Plan of Care (Signed)

## 2018-08-25 NOTE — Progress Notes (Signed)
Nutrition Follow-up  DOCUMENTATION CODES:   Non-severe (moderate) malnutrition in context of social or environmental circumstances  INTERVENTION:   - Ordered new weight (last weighed 11/19) - Ensure Enlive TID (each provides 350 kcal and 20 g protein) - Continue folvite and thiamine daily given h/o etoh abuse - Add MVI with minerals   NUTRITION DIAGNOSIS:   Moderate Malnutrition related to social / environmental circumstances(EtOH abuse) as evidenced by mild fat depletion, mild muscle depletion, moderate muscle depletion.  Ongoing  GOAL:   Patient will meet greater than or equal to 90% of their needs  Meeting  MONITOR:   Vent status, Labs, Weight trends, TF tolerance, Skin, I & O's  Vent status: extubated 11/15 Labs: sodium 133, glucose 113, BUN 21, creatinine 1.3, GFR 59%/>60%, Hgb 11.7, Hct 36.3% Weight trends: no new wt since 11/19 TF tolerance: n/a Skin: ecchymosis and skin tear on BL arms  Intake/Output Summary (Last 24 hours) at 08/25/2018 1218 Last data filed at 08/25/2018 0800 Gross per 24 hour  Intake 1400 ml  Output -  Net 1400 ml    ASSESSMENT:   57 year old male who presented to the ED on 11/13 from home with AMS. PMH significant for EtOH abuse, gout, COPD, and hypertension. Pt intubated in the ED. UDS positive for benzodiazepines. Of note, pt lost his wife on 11/6. Family concerned for possible suicide attempt.  11/25 follow-up:  Meds: Ensure Enlive TID, folvite 1 mg daily, thiamine 100 mg daily  Pt awake and resting at time of visit. States he likes the Ensure supplements. Does not want his foods chopped up anymore - advised pt it's for his safety.  Pt weighed 175-195# when he was younger, but weighs 140-155# more recently. Current weight 66 kg/145#.   Typically eats 2 small meals daily, skipping lunch. Does not follow any special diet. PCP has him taking a potassium pill every other day.  Diet Order:  100% meals complete, per nsg  documentation Diet Order            Diet - low sodium heart healthy        DIET DYS 2 Room service appropriate? Yes; Fluid consistency: Thin  Diet effective now              EDUCATION NEEDS:  Not appropriate for education at this time  Skin:  Skin Assessment: Skin Integrity Issues: Skin Integrity Issues:: Other (Comment) Other: skin tear and ecchymosis (BL hand, arm, elbow)  Last BM:  11/24  Height: Ht Readings from Last 1 Encounters:  08/17/18 6' (1.829 m)    Weight:  Wt Readings from Last 1 Encounters:  08/19/18 66 kg    Ideal Body Weight:  80.9 kg  BMI:  Body mass index is 19.73 kg/m.  Estimated Nutritional Needs:   Kcal:  1832 calories daily (MSJ x 1.2)  Protein:  85-100 grams  Fluid:  >/= 1.7 L  Althea Grimmer, MS, RDN, LDN Pager: [phone]

## 2018-08-25 NOTE — Consult Note (Signed)
[facility] Face-to-Face Psychiatry Consult   Reason for Consult:  Overdose  Referring Physician:  Dr Starla Link Patient Identification: Jeremiah Tucker MRN:  [id number] Principal Diagnosis: MDD (major depressive disorder), recurrent severe, without psychosis ([place]) Diagnosis:  Active Problems:   Adjustment disorder with mixed disturbance of emotions and conduct   Acute encephalopathy   Malnutrition of moderate degree   Total Time spent with patient: 45 minutes  Subjective:   Jeremiah Tucker is a 57 y.o. male patient reports taking some of his wife's benzodiazepine because he was upset she died the week before.  He also drinks alcohol, additive effect.  Recommend gabapentin 300 mg TID and taper off Valium that was started inpatient.  Contact nephew and remove wife's medications from the home.  Grief counseling may be beneficial. Dr Jake Samples assessed the patient and concurs with the plan.  HPI:  57 yo male who presented to the ED after being found passed out by his nephew.  He admits he took a few of his wife's benzodiazepines because he was upset but does not want to die.  [name] wants to live for his two nephews and his dog that misses him (talked to him on the phone) and discusses the need to get his wife's death settled (death certificates and bank accounts, etc.).  He is close to his two nephews and does not feel unsafe going home nor does he want inpatient hospitalization or rehab.  No suicidal/homicidal ideations, hallucinations, or withdrawal symptoms.  Reports he will contact his nephews or a friend if he gets upset in the future.  Regrets his actions and does not feel he has a substance abuse issue.  Future focused and psychiatrically cleared.  Past Psychiatric History: substance abuse  Risk to Self:  none Risk to Others:  none Prior Inpatient Therapy:  none Prior Outpatient Therapy:  none  Past Medical History:  Past Medical History:  Diagnosis Date  . Gout   . Hypertension    History  reviewed. No pertinent surgical history. Family History: History reviewed. No pertinent family history. Family Psychiatric  History: none Social History:  Social History   Substance and Sexual Activity  Alcohol Use Yes     Social History   Substance and Sexual Activity  Drug Use No    Social History   Socioeconomic History  . Marital status: Married    Spouse name: Not on file  . Number of children: Not on file  . Years of education: Not on file  . Highest education level: Not on file  Occupational History  . Not on file  Social Needs  . Financial resource strain: Patient refused  . Food insecurity:    Worry: Patient refused    Inability: Patient refused  . Transportation needs:    Medical: Patient refused    Non-medical: Patient refused  Tobacco Use  . Smoking status: Current Every Day Smoker    Types: Cigarettes  . Smokeless tobacco: Never Used  Substance and Sexual Activity  . Alcohol use: Yes  . Drug use: No  . Sexual activity: Not Currently  Lifestyle  . Physical activity:    Days per week: Patient refused    Minutes per session: Patient refused  . Stress: Not on file  Relationships  . Social connections:    Talks on phone: Patient refused    Gets together: Patient refused    Attends religious service: Patient refused    Active member of club or organization: Patient refused  Attends meetings of clubs or organizations: Patient refused    Relationship status: Patient refused  Other Topics Concern  . Not on file  Social History Narrative  . Not on file   Additional Social History:    Allergies:  No Known Allergies  Labs:  Results for orders placed or performed during the hospital encounter of 08/13/18 (from the past 48 hour(s))  Glucose, capillary     Status: Abnormal   Collection Time: 08/23/18  8:08 PM  Result Value Ref Range   Glucose-Capillary 122 (H) 70 - 99 mg/dL  Glucose, capillary     Status: Abnormal   Collection Time: 08/24/18 12:26  AM  Result Value Ref Range   Glucose-Capillary 116 (H) 70 - 99 mg/dL   Comment 1 Notify RN    Comment 2 Document in Chart   Glucose, capillary     Status: Abnormal   Collection Time: 08/24/18  4:10 AM  Result Value Ref Range   Glucose-Capillary 102 (H) 70 - 99 mg/dL  Glucose, capillary     Status: Abnormal   Collection Time: 08/24/18  7:32 AM  Result Value Ref Range   Glucose-Capillary 116 (H) 70 - 99 mg/dL    Current Facility-Administered Medications  Medication Dose Route Frequency Provider Last Rate Last Dose  . diazepam (VALIUM) tablet 2 mg  2 mg Oral BID Aline August, MD   2 mg at 08/25/18 1023   Or  . diazepam (VALIUM) injection 2.5 mg  2.5 mg Intravenous BID Alekh, Kshitiz, MD      . feeding supplement (ENSURE ENLIVE) (ENSURE ENLIVE) liquid 237 mL  237 mL Oral TID BM Reyne Dumas, MD   237 mL at 08/25/18 1028  . folic acid (FOLVITE) tablet 1 mg  1 mg Oral Daily Alvira Philips, [place]   1 mg at 08/25/18 1022  . haloperidol lactate (HALDOL) injection 5 mg  5 mg Intravenous Q6H PRN Omar Person, NP   5 mg at 08/21/18 0746  . heparin injection 5,000 Units  5,000 Units Subcutaneous Q8H Chesley Mires, MD   5,000 Units at 08/25/18 1313  . hydrALAZINE (APRESOLINE) injection 10 mg  10 mg Intravenous Q6H PRN Reyne Dumas, MD      . Influenza vac split quadrivalent PF (FLUARIX) injection 0.5 mL  0.5 mL Intramuscular Tomorrow-1000 Abrol, Nayana, MD      . ipratropium-albuterol (DUONEB) 0.5-2.5 (3) MG/3ML nebulizer solution 3 mL  3 mL Nebulization Q4H PRN Chesley Mires, MD      . LORazepam (ATIVAN) injection 2 mg  2 mg Intravenous Q2H PRN Domenic Polite, MD   2 mg at 08/20/18 1609  . LORazepam (ATIVAN) injection 2 mg  2 mg Intravenous Once Bodenheimer, Charles A, NP      . MEDLINE mouth rinse  15 mL Mouth Rinse BID Chesley Mires, MD   15 mL at 08/24/18 2127  . multivitamin with minerals tablet 1 tablet  1 tablet Oral Daily Aline August, MD   1 tablet at 08/25/18 1425  . nicotine  (NICODERM CQ - dosed in mg/24 hours) patch 21 mg  21 mg Transdermal Daily Aline August, MD   21 mg at 08/24/18 0825  . thiamine (VITAMIN B-1) tablet 100 mg  100 mg Oral Daily Alvira Philips,    100 mg at 08/25/18 1022    Musculoskeletal: Strength & Muscle Tone: within normal limits Gait & Station: normal Patient leans: N/A  Psychiatric Specialty Exam: Physical Exam  Nursing note and vitals reviewed. Constitutional:  He is oriented to person, place, and time. He appears well-developed.  HENT:  Head: Normocephalic.  Neck: Normal range of motion.  Respiratory: Effort normal.  Musculoskeletal: Normal range of motion.  Neurological: He is alert and oriented to person, place, and time.  Psychiatric: His speech is normal and behavior is normal. Judgment and thought content normal. His mood appears anxious. Cognition and memory are normal.    Review of Systems  Psychiatric/Behavioral: Positive for substance abuse. The patient is nervous/anxious.   All other systems reviewed and are negative.   Blood pressure (!) 112/91, pulse 82, temperature 98.2 F (36.8 C), temperature source Oral, resp. rate 16, height 6' (1.829 m), weight 64.5 kg, SpO2 96 %.Body mass index is 19.29 kg/m.  General Appearance: Casual  Eye Contact:  Good  Speech:  Normal Rate  Volume:  Normal  Mood:  Anxious, mild  Affect:  Congruent  Thought Process:  Coherent and Descriptions of Associations: Intact  Orientation:  Full (Time, Place, and Person)  Thought Content:  WDL and Logical  Suicidal Thoughts:  No  Homicidal Thoughts:  No  Memory:  Immediate;   Good Recent;   Good Remote;   Good  Judgement:  Fair  Insight:  Fair  Psychomotor Activity:  Normal  Concentration:  Concentration: Good and Attention Span: Good  Recall:  Good  Fund of Knowledge:  Good  Language:  Good  Akathisia:  No  Handed:  Right  AIMS (if indicated):     Assets:  Housing Leisure Time Physical Health Resilience Social  Support  ADL's:  Intact  Cognition:  WNL  Sleep:       Treatment Plan Summary: Adjustment disorder with mixed disturbance of emotions and conduct: -Start gabapentin 300 mg TID for alcohol/benzodiazepine abuse -Taper off the Valium that was started inpatient  Disposition: No evidence of imminent risk to self or others at present.    Waylan Boga, NP 08/25/2018 5:13 PM

## 2018-08-25 NOTE — Discharge Summary (Addendum)
Physician Discharge Summary  Jeremiah Tucker TMH:[id number] DOB: 03-21-1961 DOA: 08/13/2018  PCP: [name], MD  Admit date: 08/13/2018 Discharge date: 08/25/2018  Admitted From: Home Disposition: Home  Recommendations for Outpatient Follow-up:  1. Follow up with PCP in 1 week 2. Follow-up with psychiatry as an outpatient   Home Health: No Equipment/Devices: None Discharge Condition: Stable CODE STATUS: Full Diet recommendation: Heart Healthy  Brief/Interim Summary: 57 year old male with history of alcohol abuse, hypertension presented on 08/13/2018 with altered mental status and was intubated in the ER.  He was admitted to ICU under critical care service.  Urine drug screen was positive for benzodiazepines.  Patient's wife had passed away 1 week prior to admission.  Patient was also started on Unasyn for probable aspiration pneumonia.  EEG was negative for epileptiform activity.  He was extubated on 08/15/2018 and was transferred to the floor on 08/16/2018.  Patient has had episodes of intermittent agitation.  He was transferred to [facility] on 08/17/2018.  PT recommended SNF placement.  Psychiatry recommended inpatient psychiatric hospitalization.  He will be discharged to psychiatric facility once bed is available.  Addendum: Patient is waiting to be discharged to [facility] psych unit.  He is medically stable for discharge.   Addendum: Patient is medically stable for discharge.  He is doing much better with physical therapy.  He might qualify from [facility] discharge and might not need [facility] psych unit.  Will follow-up with psychiatry regarding the same.  Addendum: Patient has been cleared by psychiatry for discharge and does not need [facility] admission.  Will start on gabapentin as per psychiatry recommendations.  Discharge home with outpatient follow-up with PCP and psychiatry.  Discharge Diagnoses:  Principal Problem:   MDD (major depressive disorder), recurrent severe, without psychosis  ([place]) Active Problems:   Acute encephalopathy   Malnutrition of moderate degree  Acute toxic encephalopathy -Probably secondary to benzodiazepine overdose along with alcohol intoxication, now with alcohol withdrawal/DTs -Patient is off restraints currently.  Mental status is much more stable. -Fall precautions.  Continue bedside sitter -Use Ativan/Haldol as needed severe agitation.  Continue scheduled Valium while patient is still in the hospital. -Diet as per SLP recommendations -Continue PT evaluation.  -Psychiatry evaluation appreciated: Recommend inpatient psychiatric hospitalization when medically cleared.  -Psychiatry reevaluated the patient and has cleared the patient to be discharged.  Discharge patient home on oral gabapentin as per psychiatric recommendations.  Valium will be stopped on discharge.  Alcohol abuse with alcohol intoxication and withdrawal -Currently on CIWA protocol.  Continue thiamine, folic acid and multivitamin on discharge. -Much improved.  Acute hypoxic respiratory failure with probable aspiration -Resolved.  Patient has been extubated and is currently on room air. -Patient was briefly treated with antibiotics which have been discontinued  Leukocytosis -Resolved  Hypomagnesemia -Replaced.    Improved.  Moderate malnutrition -Follow nutrition recommendations  Essential hypertension -Home meds on hold.  Monitor blood pressure.  Outpatient follow-up  Macrocytic anemia -Hemoglobin stable.  Outpatient follow-up  Orbital tumor -Outpatient follow-up with ophthalmologist  Discharge Instructions  Discharge Instructions    Call MD for:  difficulty breathing, headache or visual disturbances   Complete by:  As directed    Call MD for:  extreme fatigue   Complete by:  As directed    Call MD for:  hives   Complete by:  As directed    Call MD for:  persistant dizziness or light-headedness   Complete by:  As directed    Call MD for:  persistant  nausea and vomiting   Complete by:  As directed    Call MD for:  severe uncontrolled pain   Complete by:  As directed    Call MD for:  temperature >100.4   Complete by:  As directed    Diet - low sodium heart healthy   Complete by:  As directed    Increase activity slowly   Complete by:  As directed      Allergies as of 08/26/2018   No Known Allergies     Medication List    STOP taking these medications   olmesartan-hydrochlorothiazide 40-12.5 MG tablet Commonly known as:  BENICAR HCT   potassium chloride 10 MEQ CR capsule Commonly known as:  MICRO-K     TAKE these medications   allopurinol 300 MG tablet Commonly known as:  ZYLOPRIM Take 300 mg by mouth daily.   folic acid 1 MG tablet Commonly known as:  FOLVITE Take 1 tablet (1 mg total) by mouth daily.   gabapentin 300 MG capsule Commonly known as:  NEURONTIN Take 1 capsule (300 mg total) by mouth 3 (three) times daily.   lovastatin 40 MG tablet Commonly known as:  MEVACOR Take 40 mg by mouth at bedtime.   multivitamin with minerals Tabs tablet Take 1 tablet by mouth daily.   thiamine 100 MG tablet Take 1 tablet (100 mg total) by mouth daily.       Follow-up Information    [facility] Follow up.   Why:  Grief counseling. Call [phone]. Contact information: [facility] [place] Follow up.   Specialty:  Professional Counselor Why:  mental health and substance use counseling Contact information: [facility] [place] [address] [phone]        [name], MD. Schedule an appointment as soon as possible for a visit in 1 week(s).   Specialty:  Internal Medicine Contact information: [address] [place] [address] [phone]          No Known Allergies  Consultations:  PCCM/psychiatry   Procedures/Studies: Ct  Head Wo Contrast  Result Date: 08/13/2018 CLINICAL DATA:  Altered mental status. EXAM: CT HEAD WITHOUT CONTRAST TECHNIQUE: Contiguous axial images were obtained from the base of the skull through the vertex without intravenous contrast. COMPARISON:  12/31/2015 FINDINGS: Brain: No evidence of acute infarction, hemorrhage, hydrocephalus, extra-axial collection or mass lesion/mass effect. There is ventricular and sulcal enlargement reflecting atrophy advanced for age. Vascular: No hyperdense vessel or unexpected calcification. Skull: Normal. Negative for fracture or focal lesion. Sinuses/Orbits: Homogeneous, 16 x 10 x 12 mm mass in the left orbit abutting the inferior rectus. Globes and orbits otherwise unremarkable. Mild left maxillary sinus mucosal thickening. Mild right ethmoid and left maxillary sinus mucosal thickening. Clear mastoid air cells. Right-sided nasal cannula. Other: None. IMPRESSION: 1. No acute intracranial abnormalities. 2. Atrophy advanced for age. 3. 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 It measures 16 mm in greatest dimension. This would be better characterized with orbital MRI with and without contrast. Electronically Signed   By: Lajean Manes M.D.   On: 08/13/2018 15:27   Dg Chest Port 1 View  Result Date: 08/15/2018 CLINICAL DATA:  Hypoxia EXAM: PORTABLE CHEST 1 VIEW COMPARISON:  August 13, 2018 FINDINGS: Endotracheal tube tip is 5.0 cm above the carina. Nasogastric tube  tip is in the stomach with the side port essentially at the gastroesophageal junction. No pneumothorax. No edema or consolidation. Heart size and pulmonary vascularity are normal. No adenopathy. There is aortic atherosclerosis. There old healed rib fractures on the right. IMPRESSION: Tube and catheter positions as described without pneumothorax. Nasogastric tube side port is at the gastroesophageal junction. Advise advancing nasogastric tube 4-5 cm to ensure  both tube tip and side port are within the stomach. No edema or consolidation. Heart size normal. There is aortic atherosclerosis. Aortic Atherosclerosis (ICD10-I70.0). Electronically Signed   By: Lowella Grip III M.D.   On: 08/15/2018 07:29   Dg Chest Portable 1 View  Result Date: 08/13/2018 CLINICAL DATA:  Status post intubation EXAM: PORTABLE CHEST 1 VIEW COMPARISON:  08/13/2018 FINDINGS: Endotracheal tube with the tip 3.5 cm above the carina. Nasogastric tube coursing below the diaphragm. Elevation of the left diaphragm. Mild left basilar airspace disease likely reflecting atelectasis versus pneumonia. No focal consolidation. No pleural effusion or pneumothorax. Stable cardiomediastinal silhouette. No acute osseous abnormality. IMPRESSION: 1. Endotracheal tube with the tip 3.5 cm of the the carina. Nasogastric tube coursing below the diaphragm. 2. Mild left basilar airspace disease which may reflect atelectasis versus pneumonia. Electronically Signed   By: Kathreen Devoid   On: 08/13/2018 17:11   Dg Chest Portable 1 View  Result Date: 08/13/2018 CLINICAL DATA:  Did dyspnea and altered mentation EXAM: PORTABLE CHEST 1 VIEW COMPARISON:  05/19/2013 FINDINGS: Emphysematous hyperinflation of the lungs. Normal heart size. Aortic atherosclerosis at the arch without aneurysm. Remote right-sided sixth and seventh rib fractures with healing. No acute osseous abnormality. No pulmonary consolidation or edema. Blunting of the left lateral costophrenic angle is noted, nonspecific but may reflect an area of atelectasis, scarring and/or trace effusion. IMPRESSION: 1. Pulmonary hyperinflation. 2. Blunting of the left lateral costophrenic angle as above possibly related to atelectasis, scarring and/or trace effusion. 3. Aortic atherosclerosis. Electronically Signed   By: Ashley Royalty M.D.   On: 08/13/2018 13:38   Mr Rosealee Albee ZH Contrast  Result Date: 08/14/2018 CLINICAL DATA:  Mass lesion left orbit EXAM: MRI OF  THE ORBITS WITHOUT AND WITH CONTRAST TECHNIQUE: Multiplanar, multisequence MR imaging of the orbits was performed both before and after the administration of intravenous contrast. CONTRAST:  6 mL Gadovist IV COMPARISON:  CT head 08/13/2018, 12/31/2015 FINDINGS: Two mass lesions in the left inferior orbit. These are well circumscribed and homogeneous low signal on T1 and high signal on T2. Larger proximal lesion is intraconal and lateral and superior to the inferior rectus. The smaller anterior lesion is located immediately lateral to the inferior rectus. The lesion show mild-to-moderate heterogeneous enhancement. The larger proximal lesion measures 10 x 14 mm and the smaller distal lesion measures 9 mm in diameter. These are not associated with the optic nerve. Extraocular muscles normal. Optic nerve normal. No bony changes identified. The globe is normal bilaterally. Orbital fat otherwise normal. Lacrimal gland normal. Optic chiasm normal.  Cavernous sinus normal bilaterally. Limited intracranial imaging reveals moderate atrophy. 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 Mucosal edema paranasal sinuses without air-fluid level. IMPRESSION: Two masses lesions in the left inferior orbit are nearly contiguous. These are well circumscribed with heterogeneous enhancement. Review of prior studies reveals that at least the larger lesion is unchanged from 2017. The lesions are separate from the muscles and optic nerve. 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 Aggressive tumor such as metastatic disease not considered likely given the  stability over time. Hemangioma considered less likely. No other lesions. Electronically Signed   By: Franchot Gallo M.D.   On: 08/14/2018 18:16    Subjective: Patient seen and examined at bedside.  He is awake and answers questions but is a poor historian.  No overnight fever, nausea or vomiting.  No agitation reported by nursing staff. Discharge  Exam: Vitals:   08/24/18 1936 08/25/18 0455  BP: 114/70 125/71  Pulse: 86 81  Resp: 16 16  Temp: 98.3 F (36.8 C) 98 F (36.7 C)  SpO2: 95% 93%   Vitals:   08/24/18 1146 08/24/18 1600 08/24/18 1936 08/25/18 0455  BP: 122/68 133/67 114/70 125/71  Pulse:   86 81  Resp:   16 16  Temp:   98.3 F (36.8 C) 98 F (36.7 C)  TempSrc:   Oral Oral  SpO2:   95% 93%  Weight:      Height:        General: Pt is awake, answers some questions.  Poor historian.  No agitation.  Looks calm.   Cardiovascular: rate controlled, S1/S2 + Respiratory: bilateral decreased breath sounds at bases Abdominal: Soft, NT, ND, bowel sounds + Extremities: no edema, no cyanosis    The results of significant diagnostics from this hospitalization (including imaging, microbiology, ancillary and laboratory) are listed below for reference.     Microbiology: No results found for this or any previous visit (from the past 240 hour(s)).   Labs: BNP (last 3 results) No results for input(s): BNP in the last 8760 hours. Basic Metabolic Panel: Recent Labs  Lab 08/19/18 0310 08/20/18 0227 08/21/18 0322 08/22/18 0328 08/23/18 0312  NA 142 138 141 137 133*  K 3.4* 3.8 4.6 4.6 4.6  CL 106 103 109 102 99  CO2 25 26 26 27 25   GLUCOSE 92 92 73 115* 113*  BUN 15 15 16  21* 21*  CREATININE 1.11 1.06 1.11 1.18 1.30*  CALCIUM 8.9 8.9 9.4 9.8 9.5  MG 1.1* 2.0 1.5* 1.4* 1.7   Liver Function Tests: Recent Labs  Lab 08/20/18 0227 08/22/18 0328  AST 27 25  ALT 19 24  ALKPHOS 49 52  BILITOT 0.8 0.4  PROT 6.4* 6.1*  ALBUMIN 3.0* 2.8*   No results for input(s): LIPASE, AMYLASE in the last 168 hours. No results for input(s): AMMONIA in the last 168 hours. CBC: Recent Labs  Lab 08/20/18 0227 08/22/18 0328 08/23/18 0312  WBC 7.8 8.2 8.2  NEUTROABS  --  4.9 5.1  HGB 11.6* 11.2* 11.7*  HCT 36.5* 34.6* 36.3*  MCV 103.4* 102.4* 101.7*  PLT 229 241 268   Cardiac Enzymes: No results for input(s): CKTOTAL,  CKMB, CKMBINDEX, TROPONINI in the last 168 hours. BNP: Invalid input(s): POCBNP CBG: Recent Labs  Lab 08/23/18 1618 08/23/18 2008 08/24/18 0026 08/24/18 0410 08/24/18 0732  GLUCAP 103* 122* 116* 102* 116*   D-Dimer No results for input(s): DDIMER in the last 72 hours. Hgb A1c No results for input(s): HGBA1C in the last 72 hours. Lipid Profile No results for input(s): CHOL, HDL, LDLCALC, TRIG, CHOLHDL, LDLDIRECT in the last 72 hours. Thyroid function studies No results for input(s): TSH, T4TOTAL, T3FREE, THYROIDAB in the last 72 hours.  Invalid input(s): FREET3 Anemia work up No results for input(s): VITAMINB12, FOLATE, FERRITIN, TIBC, IRON, RETICCTPCT in the last 72 hours. Urinalysis    Component Value Date/Time   COLORURINE YELLOW 08/13/2018 1504   APPEARANCEUR CLEAR 08/13/2018 1504   LABSPEC 1.016 08/13/2018 1504  PHURINE 5.0 08/13/2018 1504   GLUCOSEU NEGATIVE 08/13/2018 1504   HGBUR NEGATIVE 08/13/2018 1504   BILIRUBINUR NEGATIVE 08/13/2018 1504   KETONESUR 5 (A) 08/13/2018 1504   PROTEINUR NEGATIVE 08/13/2018 1504   NITRITE NEGATIVE 08/13/2018 1504   LEUKOCYTESUR NEGATIVE 08/13/2018 1504   Sepsis Labs Invalid input(s): PROCALCITONIN,  WBC,  LACTICIDVEN Microbiology No results found for this or any previous visit (from the past 240 hour(s)).   Time coordinating discharge: 35 minutes  SIGNED:   Aline August, MD  [facility] 08/25/2018, 10:01 AM Pager: [phone]  If 7PM-7AM, please contact night-coverage [email or web address] Password [email or web address]

## 2018-08-26 MED ORDER — GABAPENTIN 600 MG PO TABS
300.0000 mg | ORAL_TABLET | Freq: Three times a day (TID) | ORAL | Status: DC
  Administered 2018-08-26: 300 mg via ORAL
  Filled 2018-08-26: qty 1

## 2018-08-26 MED ORDER — DIAZEPAM 2 MG PO TABS
1.0000 mg | ORAL_TABLET | Freq: Every day | ORAL | Status: DC
  Administered 2018-08-26: 1 mg via ORAL
  Filled 2018-08-26: qty 1

## 2018-08-26 MED ORDER — DIAZEPAM 5 MG/ML IJ SOLN: 2.5000 mg | Freq: Every day | INTRAMUSCULAR | Status: DC

## 2018-08-26 MED ORDER — GABAPENTIN 300 MG PO CAPS: 300.0000 mg | ORAL_CAPSULE | Freq: Three times a day (TID) | ORAL | 0 refills | Status: AC

## 2018-08-26 NOTE — Progress Notes (Signed)
Patient ID: Jeremiah Tucker, male   DOB: 10/28/1960, 57 y.o.   MRN: [id number] Patient has been cleared by psychiatry for discharge and does not need [facility] admission.  Patient is medically stable for discharge. Will start on gabapentin as per psychiatry recommendations.  Discharge home with outpatient follow-up with PCP and psychiatry.

## 2018-08-26 NOTE — Clinical Social Work Note (Signed)
Patient has been psychiatrically cleared and can return home at discharge.  CSW signing off. Consult again if any other social work needs arise.  Dayton Scrape, [place]

## 2018-08-26 NOTE — Progress Notes (Signed)
Physical Therapy Treatment Patient Details Name: Jeremiah Tucker MRN: [id number] DOB: September 13, 1961 Today's Date: 08/26/2018    History of Present Illness 57 year old male with h/o of hypertension and alcohol abuse was brought to the emergency room after being found obtunded, minimally responsive, was suspected to be secondary to drug overdose, benzos noted in urine drug screen, alcohol intoxication, intubated on 11/13, extubated 11/15. Now in DTs.    PT Comments    Patient mobilizing well today, ambulating increased distance and performed stair negotiation without difficulty. Supervision for safety but overall has reached adequate performance for no further acute PT needs. Patient thankful for services. Will sign off.   Follow Up Recommendations  Supervision/Assistance - 24 hour     Equipment Recommendations       Recommendations for Other Services       Precautions / Restrictions Precautions Precautions: Fall Restrictions Weight Bearing Restrictions: No    Mobility  Bed Mobility Overal bed mobility: Modified Independent Bed Mobility: Supine to Sit     Supine to sit: Modified independent (Device/Increase time)        Transfers Overall transfer level: Needs assistance Equipment used: None Transfers: Sit to/from Stand Sit to Stand: Supervision         General transfer comment: supervision for safety, no device today  Ambulation/Gait Ambulation/Gait assistance: Supervision Gait Distance (Feet): 690 Feet Assistive device: None Gait Pattern/deviations: Step-through pattern;Decreased stride length;Trunk flexed Gait velocity: decreased Gait velocity interpretation: 1.31 - 2.62 ft/sec, indicative of limited community ambulator General Gait Details: improved stability, no LOB today   Stairs Stairs: Yes Stairs assistance: Supervision Stair Management: Two rails Number of Stairs: 8 General stair comments: supervision for safety, no physical assist  required   Wheelchair Mobility    Modified Rankin (Stroke Patients Only)       Balance Overall balance assessment: Needs assistance Sitting-balance support: Feet supported Sitting balance-Leahy Scale: Good     Standing balance support: During functional activity Standing balance-Leahy Scale: Good                              Cognition Arousal/Alertness: Awake/alert Behavior During Therapy: WFL for tasks assessed/performed Overall Cognitive Status: Impaired/Different from baseline Area of Impairment: Attention;Memory;Following commands;Awareness;Safety/judgement                   Current Attention Level: Alternating   Following Commands: Follows one step commands consistently;Follows multi-step commands consistently   Awareness: Anticipatory          Exercises      General Comments        Pertinent Vitals/Pain Faces Pain Scale: No hurt    Home Living                      Prior Function            PT Goals (current goals can now be found in the care plan section) Acute Rehab PT Goals Patient Stated Goal: to go home tomorrow PT Goal Formulation: With patient Time For Goal Achievement: 09/02/18 Potential to Achieve Goals: Good Progress towards PT goals: Progressing toward goals    Frequency    Min 3X/week      PT Plan Current plan remains appropriate    Co-evaluation              AM-PAC PT "6 Clicks" Mobility   Outcome Measure  Help needed turning from your back to your  side while in a flat bed without using bedrails?: None Help needed moving from lying on your back to sitting on the side of a flat bed without using bedrails?: None Help needed moving to and from a bed to a chair (including a wheelchair)?: None Help needed standing up from a chair using your arms (e.g., wheelchair or bedside chair)?: None Help needed to walk in hospital room?: A Little Help needed climbing 3-5 steps with a railing? : A  Little 6 Click Score: 22    End of Session Equipment Utilized During Treatment: Gait belt Activity Tolerance: Patient tolerated treatment well Patient left: in bed;with call bell/phone within reach(sitting EOB with sitter present) Nurse Communication: Mobility status PT Visit Diagnosis: Unsteadiness on feet (R26.81);Difficulty in walking, not elsewhere classified (R26.2);Other symptoms and signs involving the nervous system (D82.641)     Time: 5830-9407 PT Time Calculation (min) (ACUTE ONLY): 13 min  Charges:  $Gait Training: 8-22 mins                     Alben Deeds, PT DPT  Board Certified Neurologic Specialist [place] Pager [phone] Office [phone]    Duncan Dull 08/26/2018, 12:01 PM

## 2018-08-26 NOTE — Progress Notes (Signed)
CSW talked with patient at bedside. Patient was alert and oriented. Patient explain he had recently loss his wife. They were married for 39 years. CSW express condolences for his lost and  provided the patient with outpatient [place] resources in the community. CSW encourage the patient to reach out to the agencies for support. Patient states that he would.  Jeremiah Tucker, [place] Social Worker [phone]

## 2018-08-26 NOTE — Progress Notes (Signed)
Patient in a stable condition, discharge education reviewed with patient he verbalized understanding, he refused flu vaccination, patient education completed, iv removed, tele dc ccmd notified, patient belongings at bedside, patient awaiting his nephew for transportation home.

## 2018-08-26 NOTE — Care Management Note (Addendum)
Case Management Note Marvetta Gibbons RN, BSN Transitions of Care Unit 4E- RN Case Manager [phone]  Patient Details  Name: Jeremiah Tucker MRN: [id number] Date of Birth: 06-Mar-1961  Subjective/Objective:  Pt admitted after being found down, OD with benzos, ETOH, intubated on admission- ext 11/15                  Action/Plan: PTA pt lived at home, wife recently passed on Aug 29, 2023. Have requested MD to consult Psych on 11/21. Per PT eval recommendation for SNF- CSW following for transition needs  Expected Discharge Date:  08/26/18               Expected Discharge Plan:  Psychiatric Hospital  In-House Referral:  Clinical Social Work  Discharge planning Services  CM Consult  Post Acute Care Choice:  NA Choice offered to:  NA  DME Arranged:    DME Agency:     HH Arranged:    [place] Agency:     Status of Service:  In process, will continue to follow  If discussed at Long Length of Stay Meetings, dates discussed:  11/26  Discharge Disposition: home/self care   Additional Comments:  08/26/18- 1015- Marvetta Gibbons RN, CM- pt has progressed and ambulating 600+ ft with, independent in room. Pt has family support at home. Psych re-assessed on 11/25 and has cleared pt for discharge. Pt no longer needed inpt psych and can return home with family. CSW to f/u for community resources. HH orders placed- however pt does not meet home bound status and would not qualify for [facility] services.   08/22/18- 1020- Crescent Gotham RN, CM- per psych eval recommendation for INPT psych - CSW following for inpt psych placement when pt medically stable for transition. Pt will need to be ambulatory, oriented, and out of restraints- have spoken to bedside RN- Juliann Pulse regarding goals for transition.   Dawayne Patricia, RN 08/26/2018, 10:27 AM

## 2019-11-30 DEATH — deceased

## 2020-08-30 IMAGING — DX DG CHEST 1V PORT
2 series · 2 of 2 positions shown · non-contrast
Comparison: August 13, 2018

CLINICAL DATA: Hypoxia

EXAM:
PORTABLE CHEST 1 VIEW

[chest ap (1 of 2)]
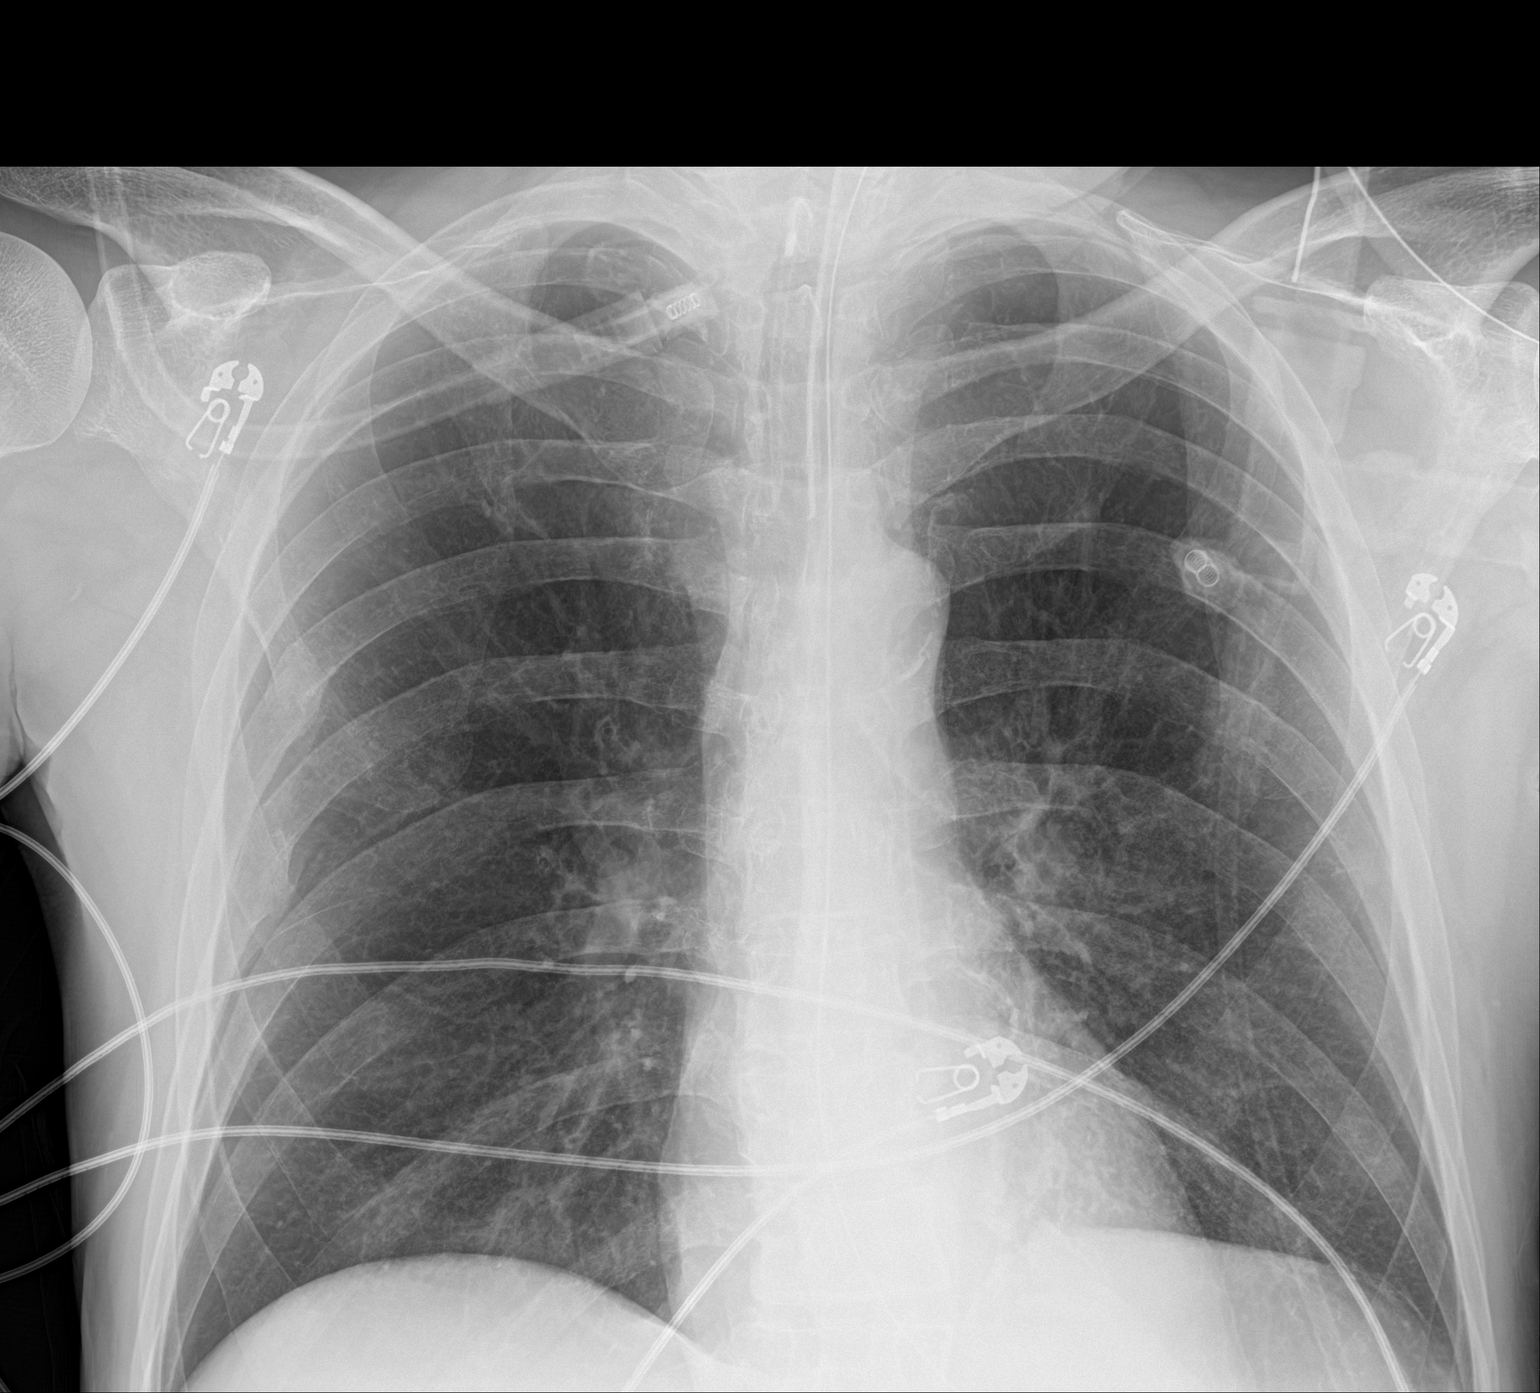

[chest ap (2 of 2)]
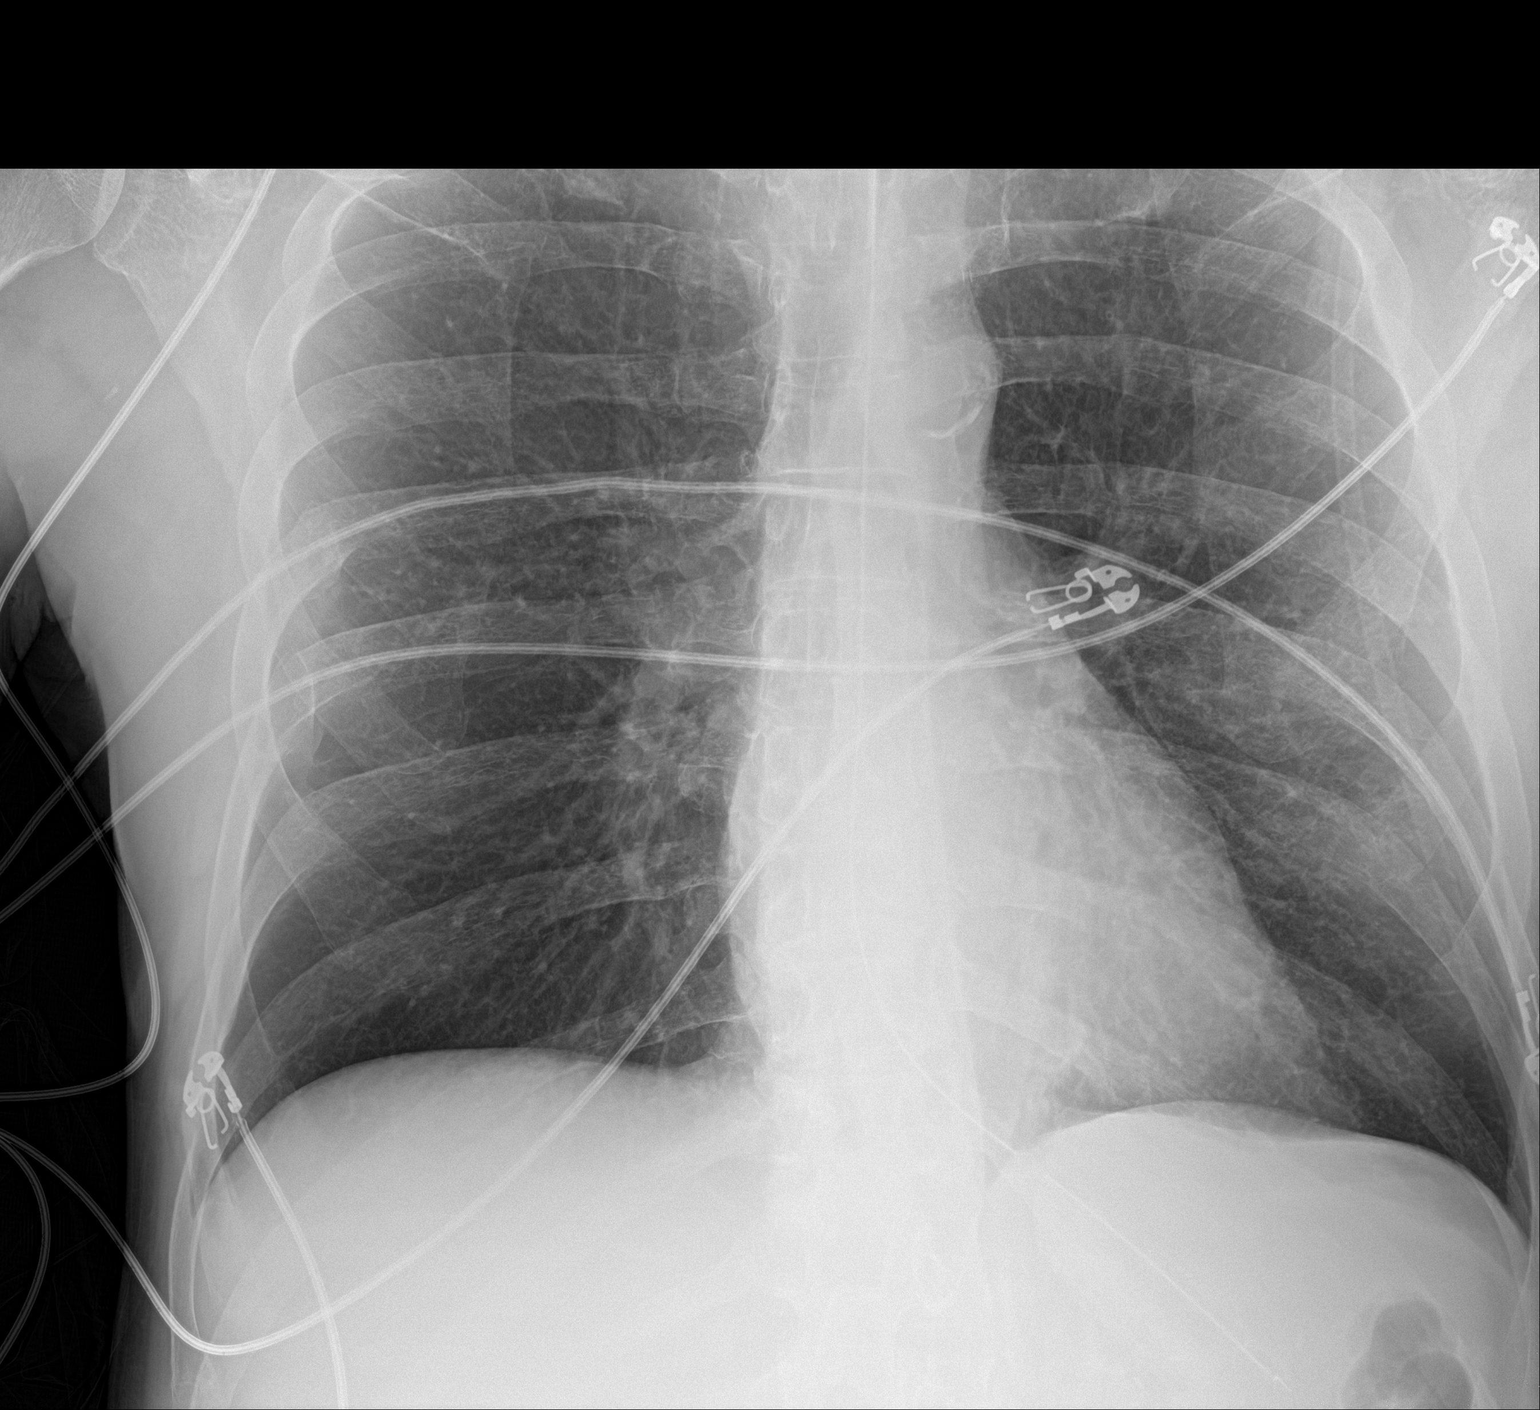

[2 of 2 positions shown; findings below may reference images not displayed]

FINDINGS: Endotracheal tube tip is 5.0 cm above the carina. Nasogastric tube
tip is in the stomach with the side port essentially at the
gastroesophageal junction. No pneumothorax. No edema or
consolidation. Heart size and pulmonary vascularity are normal. No
adenopathy. There is aortic atherosclerosis. There old healed rib
fractures on the right.
IMPRESSION: Tube and catheter positions as described without pneumothorax.
Nasogastric tube side port is at the gastroesophageal junction.
Advise advancing nasogastric tube 4-5 cm to ensure both tube tip and
side port are within the stomach.

No edema or consolidation. Heart size normal. There is aortic
atherosclerosis.

Aortic Atherosclerosis ([id number]-G0G.G).
